# Patient Record
Sex: Female | Born: 1995 | Race: Black or African American | Hispanic: No | Marital: Single | Smoking: Former smoker
Health system: Southern US, Community
[De-identification: ages and names within clinical notes are randomized; demographics above are authoritative.]

## PROBLEM LIST (undated history)

## (undated) ENCOUNTER — Inpatient Hospital Stay (HOSPITAL_COMMUNITY): Payer: Self-pay

## (undated) DIAGNOSIS — Z3490 Encounter for supervision of normal pregnancy, unspecified, unspecified trimester: Secondary | ICD-10-CM

## (undated) DIAGNOSIS — B9689 Other specified bacterial agents as the cause of diseases classified elsewhere: Secondary | ICD-10-CM

## (undated) DIAGNOSIS — A749 Chlamydial infection, unspecified: Secondary | ICD-10-CM

## (undated) DIAGNOSIS — N760 Acute vaginitis: Secondary | ICD-10-CM

## (undated) HISTORY — PX: NO PAST SURGERIES: SHX2092

---
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
## 2016-04-02 ENCOUNTER — Encounter (HOSPITAL_COMMUNITY): Payer: Self-pay | Admitting: Emergency Medicine

## 2016-04-02 ENCOUNTER — Emergency Department (HOSPITAL_COMMUNITY)
Admission: EM | Admit: 2016-04-02 | Discharge: 2016-04-02 | Disposition: A | Discharge status: Home / Self Care | Payer: Medicaid Other | Attending: Emergency Medicine | Admitting: Emergency Medicine

## 2016-04-02 DIAGNOSIS — F17210 Nicotine dependence, cigarettes, uncomplicated: Secondary | ICD-10-CM | POA: Insufficient documentation

## 2016-04-02 DIAGNOSIS — N390 Urinary tract infection, site not specified: Secondary | ICD-10-CM

## 2016-04-02 LAB — URINALYSIS, ROUTINE W REFLEX MICROSCOPIC
BILIRUBIN URINE: NEGATIVE
GLUCOSE, UA: NEGATIVE mg/dL
Ketones, ur: NEGATIVE mg/dL
NITRITE: POSITIVE — AB
PH: 6 (ref 5.0–8.0)
Protein, ur: 30 mg/dL — AB
SPECIFIC GRAVITY, URINE: 1.015 (ref 1.005–1.030)

## 2016-04-02 LAB — URINE MICROSCOPIC-ADD ON

## 2016-04-02 LAB — PREGNANCY, URINE: Preg Test, Ur: NEGATIVE

## 2016-04-02 MED ORDER — CEPHALEXIN 500 MG PO CAPS: 500.0000 mg | ORAL_CAPSULE | Freq: Four times a day (QID) | ORAL | Status: DC

## 2016-04-02 NOTE — ED Notes (Signed)
Pt sts lower abd pain and pain with urination; pt sts currently on period

## 2016-04-02 NOTE — Discharge Instructions (Signed)

## 2016-04-02 NOTE — ED Provider Notes (Signed)
CSN: [id number]     Arrival date & time 04/02/16  1123 History   First MD Initiated Contact with Patient 04/02/16 1520     Chief Complaint  Patient presents with  . Urinary Tract Infection  . Abdominal Pain     (Consider location/radiation/quality/duration/timing/severity/associated sxs/prior Treatment) HPI 20 y.o. Female complaining of urinary frequency and suprapubic discomfort began last night.  Lmp current , normal menstrual cycle at normal time.  Denies abnormal vaginal discharge.  History of uti x 1 one year ago with similar symptoms.  Past Medical History  Diagnosis Date  . Chlamydia   . BV (bacterial vaginosis) 11/09/2012  . Pregnant 2011   Past Surgical History  Procedure Laterality Date  . No past surgeries     History reviewed. No pertinent family history. Social History  Substance Use Topics  . Smoking status: Current Every Day Smoker -- 0.20 packs/day    Types: Cigarettes  . Smokeless tobacco: Never Used  . Alcohol Use: No   OB History    Gravida Para Term Preterm AB TAB SAB Ectopic Multiple Living   3 2 2  1  1   2      Review of Systems  All other systems reviewed and are negative.     Allergies  Review of patient's allergies indicates no known allergies.  Home Medications   Prior to Admission medications   Not on File   BP 123/69 mmHg  Pulse 69  Temp(Src) 98 F (36.7 C) (Oral)  Resp 18  SpO2 100% Physical Exam  Constitutional: She is oriented to person, place, and time. She appears well-developed and well-nourished. No distress.  HENT:  Head: Normocephalic and atraumatic.  Right Ear: External ear normal.  Left Ear: External ear normal.  Nose: Nose normal.  Eyes: Conjunctivae and EOM are normal. Pupils are equal, round, and reactive to light.  Neck: Normal range of motion. Neck supple.  Pulmonary/Chest: Effort normal.  Musculoskeletal: Normal range of motion.  Neurological: She is alert and oriented to person, place, and time. She  exhibits normal muscle tone. Coordination normal.  Skin: Skin is warm and dry.  Psychiatric: She has a normal mood and affect. Her behavior is normal. Thought content normal.  Nursing note and vitals reviewed.   ED Course  Procedures (including critical care time) Labs Review Labs Reviewed  URINALYSIS, ROUTINE W REFLEX MICROSCOPIC (NOT AT [facility]) - Abnormal; Notable for the following:    APPearance TURBID (*)    Hgb urine dipstick MODERATE (*)    Protein, ur 30 (*)    Nitrite POSITIVE (*)    Leukocytes, UA LARGE (*)    All other components within normal limits  URINE MICROSCOPIC-ADD ON - Abnormal; Notable for the following:    Squamous Epithelial / LPF 0-5 (*)    Bacteria, UA MANY (*)    All other components within normal limits  PREGNANCY, URINE    Imaging Review No results found. I have personally reviewed and evaluated these images and lab results as part of my medical decision-making.   EKG Interpretation None      MDM   Final diagnoses:  UTI (lower urinary tract infection)        Margarita Grizzleanielle Jeet Shough, MD 04/02/16 [phone]

## 2016-06-28 ENCOUNTER — Emergency Department (HOSPITAL_COMMUNITY)
Admission: EM | Admit: 2016-06-28 | Discharge: 2016-06-28 | Disposition: A | Discharge status: Home / Self Care | Payer: Medicaid Other | Attending: Emergency Medicine | Admitting: Emergency Medicine

## 2016-06-28 ENCOUNTER — Encounter (HOSPITAL_COMMUNITY): Payer: Self-pay | Admitting: Emergency Medicine

## 2016-06-28 DIAGNOSIS — J0300 Acute streptococcal tonsillitis, unspecified: Secondary | ICD-10-CM

## 2016-06-28 DIAGNOSIS — F17210 Nicotine dependence, cigarettes, uncomplicated: Secondary | ICD-10-CM | POA: Insufficient documentation

## 2016-06-28 DIAGNOSIS — J029 Acute pharyngitis, unspecified: Secondary | ICD-10-CM | POA: Diagnosis present

## 2016-06-28 DIAGNOSIS — J020 Streptococcal pharyngitis: Secondary | ICD-10-CM | POA: Insufficient documentation

## 2016-06-28 LAB — RAPID STREP SCREEN (MED CTR MEBANE ONLY): Streptococcus, Group A Screen (Direct): POSITIVE — AB

## 2016-06-28 MED ORDER — IBUPROFEN 600 MG PO TABS: 600.0000 mg | ORAL_TABLET | Freq: Four times a day (QID) | ORAL | 0 refills | Status: DC | PRN

## 2016-06-28 MED ORDER — DEXAMETHASONE SODIUM PHOSPHATE 10 MG/ML IJ SOLN
10.0000 mg | Freq: Once | INTRAMUSCULAR | Status: AC
  Administered 2016-06-28: 10 mg via INTRAMUSCULAR
  Filled 2016-06-28: qty 1

## 2016-06-28 MED ORDER — PENICILLIN G BENZATHINE 1200000 UNIT/2ML IM SUSP
1.2000 10*6.[IU] | Freq: Once | INTRAMUSCULAR | Status: AC
  Administered 2016-06-28: 1.2 10*6.[IU] via INTRAMUSCULAR
  Filled 2016-06-28: qty 2

## 2016-06-28 NOTE — Discharge Instructions (Signed)
Take ibuprofen as needed for pain and fever. Return to the ER for new or worsening symptoms.

## 2016-06-28 NOTE — ED Provider Notes (Signed)
[facility]-EMERGENCY DEPT Provider Note   CSN: [id number] Arrival date & time: 06/28/16  0957   By signing my name below, I, Tracey [name], attest that this documentation has been prepared under the direction and in the presence of  Tracey [name], [place]. Electronically Signed: Clovis PuAvnee [name], ED Scribe. 06/28/16. 12:47 PM.   History   Chief Complaint Chief Complaint  Patient presents with  . swollen tonsils     The history is provided by the patient. No language interpreter was used.   HPI Comments:  Tracey [name] is a 20 y.o. female who presents to the Emergency Department complaining of sore throat x 1 day. Pt notes associated swollen tonsils, cough and discomfort while swallowing. She denies fevers, congestion and drooling. No alleviating factors noted. Pt denies any other complaints at this time.   Past Medical History:  Diagnosis Date  . BV (bacterial vaginosis) 11/09/2012  . Chlamydia   . Pregnant 2011    Patient Active Problem List   Diagnosis Date Noted  . Normal delivery 10/08/2013  . Indication for care in labor or delivery 10/07/2013    Past Surgical History:  Procedure Laterality Date  . NO PAST SURGERIES      OB History    Gravida Para Term Preterm AB Living   3 2 2   1 2    SAB TAB Ectopic Multiple Live Births   1       2       Home Medications    Prior to Admission medications   Medication Sig Start Date End Date Taking? Authorizing Provider  cephALEXin (KEFLEX) 500 MG capsule Take 1 capsule (500 mg total) by mouth 4 (four) times daily. 04/02/16   Margarita Grizzleanielle Ray, MD    Family History No family history on file.  Social History Social History  Substance Use Topics  . Smoking status: Current Every Day Smoker    Packs/day: 0.20    Types: Cigarettes  . Smokeless tobacco: Never Used  . Alcohol use No     Allergies   Review of patient's allergies indicates no known allergies.   Review of Systems Review of Systems  Constitutional: Negative for  fever.  HENT: Positive for sore throat. Negative for congestion and drooling.   Respiratory: Positive for cough.    10 Systems reviewed and are negative for acute change except as noted in the HPI.  Physical Exam Updated Vital Signs BP 117/80 (BP Location: Right Arm)   Pulse 87   Temp 98.3 F (36.8 C) (Oral)   Resp 16   LMP 06/28/2016   SpO2 100%   Physical Exam  Constitutional: She is oriented to person, place, and time. She appears well-developed and well-nourished. No distress.  HENT:  Head: Normocephalic and atraumatic.  Right Ear: External ear normal.  Left Ear: External ear normal.  Mouth/Throat: Uvula is midline. Posterior oropharyngeal erythema present. Tonsillar exudate.  Bilateral peritonsillar erythema and exudate. Uvula midline. No trismus. No evidence of PTA or RTA  Eyes: Conjunctivae are normal.  Cardiovascular: Normal rate.   Pulmonary/Chest: Effort normal.  Abdominal: She exhibits no distension.  Neurological: She is alert and oriented to person, place, and time.  Skin: Skin is warm and dry.  Psychiatric: She has a normal mood and affect.  Nursing note and vitals reviewed.    ED Treatments / Results  DIAGNOSTIC STUDIES:  Oxygen Saturation is 100% on RA, normal by my interpretation.    COORDINATION OF CARE:  12:08 PM Discussed treatment plan with  pt at bedside and pt agreed to plan.  Labs (all labs ordered are listed, but only abnormal results are displayed) Labs Reviewed  RAPID STREP SCREEN (NOT AT [facility]) - Abnormal; Notable for the following:       Result Value   Streptococcus, Group A Screen (Direct) POSITIVE (*)    All other components within normal limits    EKG  EKG Interpretation None       Radiology No results found.  Procedures Procedures (including critical care time)  Medications Ordered in ED Medications - No data to display   Initial Impression / Assessment and Plan / ED Course  I have reviewed the triage vital signs  and the nursing notes.  Pertinent labs & imaging results that were available during my care of the patient were reviewed by me and considered in my medical decision making (see chart for details).  Clinical Course    Pt rapid strep test positive. Pt is tolerating secretions. Presentation not concerning for peritonsillar abscess or spread of infection to deep spaces of the throat; patent airway. Treated with IM Pen G and decadron here. Specific return precautions discussed. Recommended PCP follow up. Pt appears safe for discharge.    Final Clinical Impressions(s) / ED Diagnoses   Final diagnoses:  Streptococcal tonsillopharyngitis    New Prescriptions Discharge Medication List as of 06/28/2016 12:36 PM    START taking these medications   Details  ibuprofen (ADVIL,MOTRIN) 600 MG tablet Take 1 tablet (600 mg total) by mouth every 6 (six) hours as needed., Starting Fri 06/28/2016, Print       I personally performed the services described in this documentation, which was scribed in my presence. The recorded information has been reviewed and is accurate.    Carlene Coria, PA-C 06/28/16 1756    Jacalyn Lefevre, MD 06/29/16 [phone]

## 2016-06-28 NOTE — ED Triage Notes (Signed)
Per pt, states right tonsil swollen, states throat sore-symptoms started yesterday

## 2016-06-28 NOTE — ED Notes (Signed)
Bed: WA27 Expected date:  Expected time:  Means of arrival:  Comments: 

## 2016-08-30 ENCOUNTER — Encounter (HOSPITAL_COMMUNITY): Payer: Self-pay

## 2016-08-30 ENCOUNTER — Emergency Department (HOSPITAL_COMMUNITY)
Admission: EM | Admit: 2016-08-30 | Discharge: 2016-08-30 | Disposition: A | Discharge status: Home / Self Care | Payer: Medicaid Other | Attending: Emergency Medicine | Admitting: Emergency Medicine

## 2016-08-30 DIAGNOSIS — B9689 Other specified bacterial agents as the cause of diseases classified elsewhere: Secondary | ICD-10-CM | POA: Diagnosis not present

## 2016-08-30 DIAGNOSIS — N760 Acute vaginitis: Secondary | ICD-10-CM | POA: Insufficient documentation

## 2016-08-30 DIAGNOSIS — B373 Candidiasis of vulva and vagina: Secondary | ICD-10-CM | POA: Insufficient documentation

## 2016-08-30 DIAGNOSIS — F17210 Nicotine dependence, cigarettes, uncomplicated: Secondary | ICD-10-CM | POA: Insufficient documentation

## 2016-08-30 DIAGNOSIS — B3731 Acute candidiasis of vulva and vagina: Secondary | ICD-10-CM

## 2016-08-30 DIAGNOSIS — L292 Pruritus vulvae: Secondary | ICD-10-CM | POA: Diagnosis present

## 2016-08-30 DIAGNOSIS — Z79899 Other long term (current) drug therapy: Secondary | ICD-10-CM | POA: Diagnosis not present

## 2016-08-30 LAB — URINALYSIS, ROUTINE W REFLEX MICROSCOPIC
BILIRUBIN URINE: NEGATIVE
GLUCOSE, UA: NEGATIVE mg/dL
HGB URINE DIPSTICK: NEGATIVE
Ketones, ur: NEGATIVE mg/dL
NITRITE: NEGATIVE
PH: 6 (ref 5.0–8.0)
Protein, ur: 30 mg/dL — AB
RBC / HPF: NONE SEEN RBC/hpf (ref 0–5)
SPECIFIC GRAVITY, URINE: 1.025 (ref 1.005–1.030)

## 2016-08-30 LAB — POC URINE PREG, ED: Preg Test, Ur: NEGATIVE

## 2016-08-30 LAB — WET PREP, GENITAL
Sperm: NONE SEEN
Trich, Wet Prep: NONE SEEN
YEAST WET PREP: NONE SEEN

## 2016-08-30 MED ORDER — METRONIDAZOLE 500 MG PO TABS: 500.0000 mg | ORAL_TABLET | Freq: Two times a day (BID) | ORAL | 0 refills | Status: DC

## 2016-08-30 MED ORDER — FLUCONAZOLE 150 MG PO TABS: 150.0000 mg | ORAL_TABLET | Freq: Once | ORAL | 0 refills | Status: AC

## 2016-08-30 NOTE — ED Triage Notes (Signed)
Pt presents for evaluation of vaginal itching x 3-4 days, states she believes she may have yeast infection. Pt denies urinary symptoms.

## 2016-08-30 NOTE — ED Notes (Signed)
MD at bedside. 

## 2016-08-30 NOTE — ED Provider Notes (Signed)
[facility] DEPT Provider Note   CSN: [id number] Arrival date & time: 08/30/16  1028     History   Chief Complaint Chief Complaint  Patient presents with  . Vaginal Itching    HPI Tracey [name] is a 20 y.o. female.  The history is provided by the patient.  Vaginal Itching  This is a new problem. The current episode started yesterday. The problem occurs constantly. The problem has been gradually worsening. Pertinent negatives include no chest pain, no abdominal pain, no headaches and no shortness of breath. The symptoms are aggravated by walking. The symptoms are relieved by position. She has tried nothing for the symptoms. The treatment provided no relief.    Past Medical History:  Diagnosis Date  . BV (bacterial vaginosis) 11/09/2012  . Chlamydia   . Pregnant 2011    Patient Active Problem List   Diagnosis Date Noted  . Normal delivery 10/08/2013  . Indication for care in labor or delivery 10/07/2013    Past Surgical History:  Procedure Laterality Date  . NO PAST SURGERIES      OB History    Gravida Para Term Preterm AB Living   3 2 2   1 2    SAB TAB Ectopic Multiple Live Births   1       2       Home Medications    Prior to Admission medications   Medication Sig Start Date End Date Taking? Authorizing Provider  cephALEXin (KEFLEX) 500 MG capsule Take 1 capsule (500 mg total) by mouth 4 (four) times daily. 04/02/16   Margarita Grizzleanielle Ray, MD  fluconazole (DIFLUCAN) 150 MG tablet Take 1 tablet (150 mg total) by mouth once. 08/30/16 08/30/16  Shaune Pollackameron Deborahann Poteat, MD  ibuprofen (ADVIL,MOTRIN) 600 MG tablet Take 1 tablet (600 mg total) by mouth every 6 (six) hours as needed. 06/28/16   Ace GinsSerena Y Sam, PA-C  metroNIDAZOLE (FLAGYL) 500 MG tablet Take 1 tablet (500 mg total) by mouth 2 (two) times daily. 08/30/16   Shaune Pollackameron Tram Wrenn, MD    Family History No family history on file.  Social History Social History  Substance Use Topics  . Smoking status: Current Every  Day Smoker    Packs/day: 0.20    Types: Cigarettes  . Smokeless tobacco: Never Used  . Alcohol use No     Allergies   Patient has no known allergies.   Review of Systems Review of Systems  Constitutional: Negative for chills and fever.  HENT: Negative for congestion, rhinorrhea and sore throat.   Eyes: Negative for visual disturbance.  Respiratory: Negative for cough, shortness of breath and wheezing.   Cardiovascular: Negative for chest pain and leg swelling.  Gastrointestinal: Negative for abdominal pain, diarrhea, nausea and vomiting.  Genitourinary: Positive for vaginal discharge. Negative for dysuria, flank pain, vaginal bleeding and vaginal pain.  Musculoskeletal: Negative for neck pain.  Skin: Negative for rash.  Allergic/Immunologic: Negative for immunocompromised state.  Neurological: Negative for syncope and headaches.  Hematological: Does not bruise/bleed easily.  All other systems reviewed and are negative.    Physical Exam Updated Vital Signs BP 114/60 (BP Location: Right Arm)   Pulse 100   Temp 99 F (37.2 C) (Oral)   Resp 15   Ht 5\' 4"  (1.626 m)   Wt 150 lb (68 kg)   LMP 07/29/2016 (Within Days)   SpO2 100%   BMI 25.75 kg/m   Physical Exam  Constitutional: She is oriented to person, place, and time. She appears well-developed  and well-nourished. No distress.  HENT:  Head: Normocephalic and atraumatic.  Eyes: Conjunctivae are normal.  Neck: Neck supple.  Cardiovascular: Normal rate, regular rhythm and normal heart sounds.  Exam reveals no friction rub.   No murmur heard. Pulmonary/Chest: Effort normal and breath sounds normal. No respiratory distress. She has no wheezes. She has no rales.  Abdominal: She exhibits no distension.  Genitourinary: Pelvic exam was performed with patient supine. There is no rash or lesion on the right labia. There is no rash or lesion on the left labia. Cervix exhibits no motion tenderness, no discharge and no friability.  Right adnexum displays no mass, no tenderness and no fullness. Left adnexum displays no mass, no tenderness and no fullness. No erythema or tenderness in the vagina. Vaginal discharge (yellow-white) found.  Musculoskeletal: She exhibits no edema.  Neurological: She is alert and oriented to person, place, and time. She exhibits normal muscle tone.  Skin: Skin is warm. Capillary refill takes less than 2 seconds.  Psychiatric: She has a normal mood and affect.  Nursing note and vitals reviewed.    ED Treatments / Results  Labs (all labs ordered are listed, but only abnormal results are displayed) Labs Reviewed  WET PREP, GENITAL - Abnormal; Notable for the following:       Result Value   Clue Cells Wet Prep HPF POC PRESENT (*)    WBC, Wet Prep HPF POC MANY (*)    All other components within normal limits  URINALYSIS, ROUTINE W REFLEX MICROSCOPIC - Abnormal; Notable for the following:    APPearance CLOUDY (*)    Protein, ur 30 (*)    Leukocytes, UA LARGE (*)    Bacteria, UA MANY (*)    Squamous Epithelial / LPF TOO NUMEROUS TO COUNT (*)    All other components within normal limits  POC URINE PREG, ED  GC/CHLAMYDIA PROBE AMP ([place]    EKG  EKG Interpretation None       Radiology No results found.  Procedures Procedures (including critical care time)  Medications Ordered in ED Medications - No data to display   Initial Impression / Assessment and Plan / ED Course  I have reviewed the triage vital signs and the nursing notes.  Pertinent labs & imaging results that were available during my care of the patient were reviewed by me and considered in my medical decision making (see chart for details).  Clinical Course     20 yo F here with mild vaginal itching and white discharge. On arrival, VSS and WNL. Abdomen soft, NT, and ND. No CMT or adnexal pain. Wet prep + BV. Suspect BV with possible mild yeast infection. No evidence of PID, TOA, or ovarian  pathology. Pt o/w HDS, well appearing with soft abdomen. No evidence of cervicitis. Will treat with outpt f/u.  Final Clinical Impressions(s) / ED Diagnoses   Final diagnoses:  BV (bacterial vaginosis)  Vaginal yeast infection    New Prescriptions Discharge Medication List as of 08/30/2016  1:40 PM    START taking these medications   Details  fluconazole (DIFLUCAN) 150 MG tablet Take 1 tablet (150 mg total) by mouth once., Starting Fri 08/30/2016, Print    metroNIDAZOLE (FLAGYL) 500 MG tablet Take 1 tablet (500 mg total) by mouth 2 (two) times daily., Starting Fri 08/30/2016, Print         Shaune Pollackameron Corinthian Kemler, MD 08/30/16 [phone]

## 2016-08-30 NOTE — ED Notes (Signed)
Pelvic cart @ bedside.  

## 2016-09-02 LAB — GC/CHLAMYDIA PROBE AMP (~~LOC~~) NOT AT ARMC
CHLAMYDIA, DNA PROBE: NEGATIVE
Neisseria Gonorrhea: NEGATIVE

## 2016-09-04 ENCOUNTER — Encounter (HOSPITAL_COMMUNITY): Payer: Self-pay | Admitting: Neurology

## 2016-09-04 ENCOUNTER — Emergency Department (HOSPITAL_COMMUNITY)
Admission: EM | Admit: 2016-09-04 | Discharge: 2016-09-04 | Disposition: A | Discharge status: Home / Self Care | Payer: Medicaid Other | Attending: Emergency Medicine | Admitting: Emergency Medicine

## 2016-09-04 DIAGNOSIS — F17210 Nicotine dependence, cigarettes, uncomplicated: Secondary | ICD-10-CM | POA: Diagnosis not present

## 2016-09-04 DIAGNOSIS — M791 Myalgia: Secondary | ICD-10-CM | POA: Insufficient documentation

## 2016-09-04 DIAGNOSIS — J069 Acute upper respiratory infection, unspecified: Secondary | ICD-10-CM | POA: Insufficient documentation

## 2016-09-04 NOTE — ED Triage Notes (Signed)
Pt reports body aches, runny nose, cough x 3 days. Denies fevers.

## 2016-09-04 NOTE — ED Provider Notes (Signed)
[facility] Provider Note   CSN: [id number] Arrival date & time: 09/04/16  1745  By signing my name below, I, Rosario AdieWilliam Andrew Hiatt, attest that this documentation has been prepared under the direction and in the presence of Rolland PorterMark Charlesetta Milliron, MD. Electronically Signed: Rosario AdieWilliam Andrew Hiatt, ED Scribe. 09/04/16. 6:27 PM.  History   Chief Complaint Chief Complaint  Patient presents with  . Generalized Body Aches  . URI   The history is provided by the patient. No language interpreter was used.   HPI Comments: Tracey [name] is a 20 y.o. female with no pertinent PMHx, who presents to the Emergency Department complaining of gradually worsening, constant generalized myalgias/arthralgias onset 3 days ago. She reports associated rhinorrhea, nasal congestion, and intermittent, dry cough secondary to her myalgia/arthragias. No noted treatments were tried prior to coming into the ED. She denies fever, chills, sore throat, nausea, vomiting, abdominal pain, ear pain, or any other associated symptoms.   Past Medical History:  Diagnosis Date  . BV (bacterial vaginosis) 11/09/2012  . Chlamydia   . Pregnant 2011   Patient Active Problem List   Diagnosis Date Noted  . Normal delivery 10/08/2013  . Indication for care in labor or delivery 10/07/2013   Past Surgical History:  Procedure Laterality Date  . NO PAST SURGERIES     OB History    Gravida Para Term Preterm AB Living   3 2 2   1 2    SAB TAB Ectopic Multiple Live Births   1       2     Home Medications    Prior to Admission medications   Medication Sig Start Date End Date Taking? Authorizing Provider  cephALEXin (KEFLEX) 500 MG capsule Take 1 capsule (500 mg total) by mouth 4 (four) times daily. 04/02/16   Margarita Grizzleanielle Ray, MD  ibuprofen (ADVIL,MOTRIN) 600 MG tablet Take 1 tablet (600 mg total) by mouth every 6 (six) hours as needed. 06/28/16   Ace GinsSerena Y Sam, PA-C  metroNIDAZOLE (FLAGYL) 500 MG tablet Take 1 tablet (500 mg total)  by mouth 2 (two) times daily. 08/30/16   Shaune Pollackameron Isaacs, MD   Family History No family history on file.  Social History Social History  Substance Use Topics  . Smoking status: Current Every Day Smoker    Packs/day: 0.20    Types: Cigarettes  . Smokeless tobacco: Never Used  . Alcohol use No   Allergies   Patient has no known allergies.  Review of Systems Review of Systems  Constitutional: Negative for appetite change, chills, diaphoresis, fatigue and fever.  HENT: Positive for congestion and rhinorrhea. Negative for ear pain, mouth sores, sore throat and trouble swallowing.   Eyes: Negative for visual disturbance.  Respiratory: Positive for cough. Negative for chest tightness, shortness of breath and wheezing.   Cardiovascular: Negative for chest pain.  Gastrointestinal: Negative for abdominal distention, abdominal pain, diarrhea, nausea and vomiting.  Endocrine: Negative for polydipsia, polyphagia and polyuria.  Genitourinary: Negative for dysuria, frequency and hematuria.  Musculoskeletal: Positive for arthralgias and myalgias. Negative for gait problem.  Skin: Negative for color change, pallor and rash.  Neurological: Negative for dizziness, syncope, light-headedness and headaches.  Hematological: Does not bruise/bleed easily.  Psychiatric/Behavioral: Negative for behavioral problems and confusion.   Physical Exam Updated Vital Signs BP 122/77 (BP Location: Left Arm)   Pulse 115   Temp 98.3 F (36.8 C) (Oral)   Resp 18   Ht 5\' 4"  (1.626 m)   Wt 150 lb (68  kg)   LMP 07/29/2016 (Within Days)   SpO2 98%   BMI 25.75 kg/m   Physical Exam  Constitutional: She is oriented to person, place, and time. She appears well-developed and well-nourished. No distress.  HENT:  Head: Normocephalic.  Eyes: Conjunctivae are normal. Pupils are equal, round, and reactive to light. No scleral icterus.  Neck: Normal range of motion. Neck supple. No thyromegaly present.  Cardiovascular:  Normal rate and regular rhythm.  Exam reveals no gallop and no friction rub.   No murmur heard. Pulmonary/Chest: Effort normal and breath sounds normal. No respiratory distress. She has no wheezes. She has no rales.  Abdominal: Soft. Bowel sounds are normal. She exhibits no distension. There is no tenderness. There is no rebound.  Musculoskeletal: Normal range of motion.  Neurological: She is alert and oriented to person, place, and time.  Skin: Skin is warm and dry. No rash noted.  Psychiatric: She has a normal mood and affect. Her behavior is normal.   ED Treatments / Results  DIAGNOSTIC STUDIES: Oxygen Saturation is 98% on RA, normal by my interpretation.   COORDINATION OF CARE: 6:26 PM-Discussed next steps with pt. Pt verbalized understanding and is agreeable with the plan.   Labs (all labs ordered are listed, but only abnormal results are displayed) Labs Reviewed - No data to display  EKG  EKG Interpretation None      Radiology No results found.  Procedures Procedures   Medications Ordered in ED Medications - No data to display  Initial Impression / Assessment and Plan / ED Course  I have reviewed the triage vital signs and the nursing notes.  Pertinent labs & imaging results that were available during my care of the patient were reviewed by me and considered in my medical decision making (see chart for details).  Clinical Course      Final Clinical Impressions(s) / ED Diagnoses   Final diagnoses:  Upper respiratory tract infection, unspecified type   New Prescriptions New Prescriptions   No medications on file   Normal exam.  No findings to suggest supportive secondary infection. Recommend expectant management, over-the-counter treatment.    Rolland PorterMark Amen Dargis, MD 09/04/16 [phone]

## 2016-09-16 NOTE — L&D Delivery Note (Signed)
  Patient is 21 y.o. [id number] 3241w6d admitted in SOL, hx of teen pregnancy   Delivery Note At 6:26 AM a viable female was delivered via Vaginal, Spontaneous Delivery (Presentation: LOA; vertex ).  APGAR: 9, 9; weight  pending.   Placenta status: delivered spontaneously, in tact.  Cord: 3vc with the following complications: none. Cord pH: not sent  Anesthesia:  epidural Episiotomy: None Lacerations:  none Suture Repair: none Est. Blood Loss (mL): 50    Mom to postpartum.  Baby to Couplet care / Skin to Skin.  Tillman Sersngela C Riccio 05/15/2017, 6:38 AM      Upon arrival patient was complete and pushing. She pushed with good maternal effort to deliver a healthy baby boy. Baby delivered without difficulty, was noted to have good tone and place on maternal abdomen for oral suctioning, drying and stimulation. Delayed cord clamping performed. Placenta delivered intact with 3V cord. Vaginal canal and perineum was inspected and in tact; hemostatic. Pitocin was started and uterus massaged until bleeding slowed. Counts of sharps, instruments, and lap pads were all correct.   Tillman SersAngela C Riccio, DO PGY-2 8/30/20186:38 AM  Patient is a [phone] at 5441w6d who was admitted in SOL, significant hx of teen preg x 3 and limited care, but otherwise uncomplicated prenatal course.  She progressed without augmentation.  I was gloved and present for delivery in its entirety.  Second stage of labor progressed, baby delivered after approx 3 contractions.  No decels during second stage noted.  Complications: none  Lacerations: none  EBL: 50cc  Cam HaiSHAW, KIMBERLY, CNM 6:42 AM 05/15/2017

## 2016-09-19 ENCOUNTER — Encounter (HOSPITAL_COMMUNITY): Payer: Self-pay

## 2016-09-19 ENCOUNTER — Inpatient Hospital Stay (HOSPITAL_COMMUNITY): Payer: Medicaid Other

## 2016-09-19 ENCOUNTER — Inpatient Hospital Stay (HOSPITAL_COMMUNITY)
Admission: AD | Admit: 2016-09-19 | Discharge: 2016-09-19 | Disposition: A | Discharge status: Home / Self Care | Payer: Medicaid Other | Source: Ambulatory Visit | Attending: Obstetrics & Gynecology | Admitting: Obstetrics & Gynecology

## 2016-09-19 DIAGNOSIS — F17210 Nicotine dependence, cigarettes, uncomplicated: Secondary | ICD-10-CM | POA: Insufficient documentation

## 2016-09-19 DIAGNOSIS — O26891 Other specified pregnancy related conditions, first trimester: Secondary | ICD-10-CM | POA: Diagnosis not present

## 2016-09-19 DIAGNOSIS — R109 Unspecified abdominal pain: Secondary | ICD-10-CM | POA: Diagnosis not present

## 2016-09-19 DIAGNOSIS — O99331 Smoking (tobacco) complicating pregnancy, first trimester: Secondary | ICD-10-CM | POA: Insufficient documentation

## 2016-09-19 DIAGNOSIS — Z3A01 Less than 8 weeks gestation of pregnancy: Secondary | ICD-10-CM | POA: Insufficient documentation

## 2016-09-19 DIAGNOSIS — O26899 Other specified pregnancy related conditions, unspecified trimester: Secondary | ICD-10-CM

## 2016-09-19 LAB — CBC WITH DIFFERENTIAL/PLATELET
BASOS PCT: 0 %
Basophils Absolute: 0 10*3/uL (ref 0.0–0.1)
EOS ABS: 0.1 10*3/uL (ref 0.0–0.7)
EOS PCT: 1 %
HCT: 36.1 % (ref 36.0–46.0)
HEMOGLOBIN: 12.3 g/dL (ref 12.0–15.0)
LYMPHS ABS: 2 10*3/uL (ref 0.7–4.0)
Lymphocytes Relative: 32 %
MCH: 27 pg (ref 26.0–34.0)
MCHC: 34.1 g/dL (ref 30.0–36.0)
MCV: 79.2 fL (ref 78.0–100.0)
Monocytes Absolute: 0.4 10*3/uL (ref 0.1–1.0)
Monocytes Relative: 6 %
NEUTROS PCT: 61 %
Neutro Abs: 3.9 10*3/uL (ref 1.7–7.7)
PLATELETS: 267 10*3/uL (ref 150–400)
RBC: 4.56 MIL/uL (ref 3.87–5.11)
RDW: 14.4 % (ref 11.5–15.5)
WBC: 6.3 10*3/uL (ref 4.0–10.5)

## 2016-09-19 LAB — URINALYSIS, ROUTINE W REFLEX MICROSCOPIC
Bilirubin Urine: NEGATIVE
Glucose, UA: NEGATIVE mg/dL
Hgb urine dipstick: NEGATIVE
KETONES UR: 20 mg/dL — AB
LEUKOCYTES UA: NEGATIVE
NITRITE: NEGATIVE
PROTEIN: NEGATIVE mg/dL
Specific Gravity, Urine: 1.018 (ref 1.005–1.030)
pH: 7 (ref 5.0–8.0)

## 2016-09-19 LAB — POCT PREGNANCY, URINE: Preg Test, Ur: POSITIVE — AB

## 2016-09-19 LAB — HCG, QUANTITATIVE, PREGNANCY: HCG, BETA CHAIN, QUANT, S: 36230 m[IU]/mL — AB (ref <5)

## 2016-09-19 MED ORDER — PRENATAL COMPLETE 14-0.4 MG PO TABS: 1.0000 | ORAL_TABLET | Freq: Every day | ORAL | 6 refills | Status: DC

## 2016-09-19 NOTE — Discharge Instructions (Signed)

## 2016-09-19 NOTE — MAU Note (Signed)
Pt presents to MAU with complaints of a Positive pregnancy test 3 days ago and now is experiencing lower abdominal cramping. Denies any vaginal bleeding or abnormal discharge  LMP 07/29/16

## 2016-09-19 NOTE — MAU Provider Note (Signed)
History     CSN: [id number]  Arrival date and time: 09/19/16 1421   First Provider Initiated Contact with Patient 09/19/16 1610      Chief Complaint  Patient presents with  . Possible Pregnancy  . Abdominal Pain   HPI  Ms. Tracey [name] is a 21 y.o. [phone] at [redacted]w[redacted]d who presents to MAU today with complaint of intermittent abdominal pain x 2 days. The patient states that pain has been intermittent and she denies pain now. She has not taken any pain medication. She denies vaginal bleeding, discharge, N/V/D or UTI symptoms. LMP 07/29/16 and recent +HPT  OB History    Gravida Para Term Preterm AB Living   4 2 2   1 2    SAB TAB Ectopic Multiple Live Births   1       2      Past Medical History:  Diagnosis Date  . BV (bacterial vaginosis) 11/09/2012  . Chlamydia   . Pregnant 2011    Past Surgical History:  Procedure Laterality Date  . NO PAST SURGERIES      History reviewed. No pertinent family history.  Social History  Substance Use Topics  . Smoking status: Current Every Day Smoker    Packs/day: 0.20    Types: Cigarettes  . Smokeless tobacco: Never Used  . Alcohol use No    Allergies: No Known Allergies  No prescriptions prior to admission.    Review of Systems  Constitutional: Negative for fever.  Gastrointestinal: Positive for abdominal pain. Negative for constipation, diarrhea, nausea and vomiting.  Genitourinary: Negative for dysuria, frequency, urgency, vaginal bleeding and vaginal discharge.   Physical Exam   Blood pressure 111/65, pulse 81, temperature 98.2 F (36.8 C), resp. rate 18, height 5\' 4"  (1.626 m), weight 150 lb (68 kg), last menstrual period 07/29/2016, unknown if currently breastfeeding.  Physical Exam  Nursing note and vitals reviewed. Constitutional: She is oriented to person, place, and time. She appears well-developed and well-nourished. No distress.  HENT:  Head: Normocephalic and atraumatic.  Cardiovascular: Normal rate.    Respiratory: Effort normal.  GI: Soft. She exhibits no distension and no mass. There is no tenderness. There is no rebound and no guarding.  Neurological: She is alert and oriented to person, place, and time.  Skin: Skin is warm and dry. No erythema.  Psychiatric: She has a normal mood and affect.    Results for orders placed or performed during the hospital encounter of 09/19/16 (from the past 24 hour(s))  Urinalysis, Routine w reflex microscopic     Status: Abnormal   Collection Time: 09/19/16  2:30 PM  Result Value Ref Range   Color, Urine YELLOW YELLOW   APPearance CLEAR CLEAR   Specific Gravity, Urine 1.018 1.005 - 1.030   pH 7.0 5.0 - 8.0   Glucose, UA NEGATIVE NEGATIVE mg/dL   Hgb urine dipstick NEGATIVE NEGATIVE   Bilirubin Urine NEGATIVE NEGATIVE   Ketones, ur 20 (A) NEGATIVE mg/dL   Protein, ur NEGATIVE NEGATIVE mg/dL   Nitrite NEGATIVE NEGATIVE   Leukocytes, UA NEGATIVE NEGATIVE  Pregnancy, urine POC     Status: Abnormal   Collection Time: 09/19/16  2:52 PM  Result Value Ref Range   Preg Test, Ur POSITIVE (A) NEGATIVE  CBC with Differential/Platelet     Status: None   Collection Time: 09/19/16  3:04 PM  Result Value Ref Range   WBC 6.3 4.0 - 10.5 K/uL   RBC 4.56 3.87 - 5.11 MIL/uL  Hemoglobin 12.3 12.0 - 15.0 g/dL   HCT 16.1 09.6 - 04.5 %   MCV 79.2 78.0 - 100.0 fL   MCH 27.0 26.0 - 34.0 pg   MCHC 34.1 30.0 - 36.0 g/dL   RDW 40.9 81.1 - 91.4 %   Platelets 267 150 - 400 K/uL   Neutrophils Relative % 61 %   Neutro Abs 3.9 1.7 - 7.7 K/uL   Lymphocytes Relative 32 %   Lymphs Abs 2.0 0.7 - 4.0 K/uL   Monocytes Relative 6 %   Monocytes Absolute 0.4 0.1 - 1.0 K/uL   Eosinophils Relative 1 %   Eosinophils Absolute 0.1 0.0 - 0.7 K/uL   Basophils Relative 0 %   Basophils Absolute 0.0 0.0 - 0.1 K/uL  hCG, quantitative, pregnancy     Status: Abnormal   Collection Time: 09/19/16  3:04 PM  Result Value Ref Range   hCG, Beta Chain, Quant, S 36,230 (H) <5 mIU/mL    US Ob Comp Less 14 Wks  Result Date: 09/19/2016 CLINICAL DATA:  Abdominal pain affecting pregnancy, cramping for 2-3 days ; no quantitative beta HCG available for correlation ; EGA by LMP of 07/29/2016 = 7 weeks 3 days EXAM: OBSTETRIC <14 WK [place] AND TRANSVAGINAL OB US TECHNIQUE: Both transabdominal and transvaginal ultrasound examinations were performed for complete evaluation of the gestation as well as the maternal uterus, adnexal regions, and pelvic cul-de-sac. Transvaginal technique was performed to assess early pregnancy. COMPARISON:  None for this gestation FINDINGS: Intrauterine gestational sac: Present Yolk sac:  Present Embryo:  Present Cardiac Activity: Present Heart Rate: 108  bpm CRL:  3.5  mm   5 w   6 d                  [place] EDC: 05/16/2017 Subchorionic hemorrhage:  None definitely visualized Maternal uterus/adnexae: Small corpus luteal cyst LEFT ovary. Ovaries otherwise unremarkable. Trace free pelvic fluid in LEFT adnexa. No adnexal masses. IMPRESSION: Single live intrauterine gestation measured at 5 weeks 6 days EGA by crown-rump length. No acute abnormalities. Electronically Signed   By: Ulyses Southward M.D.   On: 09/19/2016 15:57   US Ob Transvaginal  Result Date: 09/19/2016 CLINICAL DATA:  Abdominal pain affecting pregnancy, cramping for 2-3 days ; no quantitative beta HCG available for correlation ; EGA by LMP of 07/29/2016 = 7 weeks 3 days EXAM: OBSTETRIC <14 WK [place] AND TRANSVAGINAL OB US TECHNIQUE: Both transabdominal and transvaginal ultrasound examinations were performed for complete evaluation of the gestation as well as the maternal uterus, adnexal regions, and pelvic cul-de-sac. Transvaginal technique was performed to assess early pregnancy. COMPARISON:  None for this gestation FINDINGS: Intrauterine gestational sac: Present Yolk sac:  Present Embryo:  Present Cardiac Activity: Present Heart Rate: 108  bpm CRL:  3.5  mm   5 w   6 d                  [place] EDC: 05/16/2017 Subchorionic  hemorrhage:  None definitely visualized Maternal uterus/adnexae: Small corpus luteal cyst LEFT ovary. Ovaries otherwise unremarkable. Trace free pelvic fluid in LEFT adnexa. No adnexal masses. IMPRESSION: Single live intrauterine gestation measured at 5 weeks 6 days EGA by crown-rump length. No acute abnormalities. Electronically Signed   By: Ulyses Southward M.D.   On: 09/19/2016 15:57    MAU Course  Procedures None  MDM +UPT UA, CBC, quant hCG, HIV, RPR and [place] today to rule out ectopic pregnancy Patient had negative wet prep and  GC/Chlamydia 2 weeks ago O+ blood type in Epic from previous visit  Assessment and Plan  A: SIUP at 7025w6d Abdominal pain in pregnancy, first trimester  P: Discharge home Tylenol PRN for pain advised First trimester precautions discussed Patient advised to follow-up with OB provider of choice Pregnancy confirmation letter given with list of area OB providers Patient may return to MAU as needed or if her condition were to change or worsen  Marny LowensteinJulie N Wenzel, PA-C  09/19/2016, 6:03 PM

## 2016-09-20 LAB — HIV ANTIBODY (ROUTINE TESTING W REFLEX): HIV SCREEN 4TH GENERATION: NONREACTIVE

## 2016-09-20 LAB — RPR: RPR Ser Ql: NONREACTIVE

## 2016-10-25 ENCOUNTER — Encounter: Payer: Self-pay | Admitting: Internal Medicine

## 2016-10-25 ENCOUNTER — Ambulatory Visit (INDEPENDENT_AMBULATORY_CARE_PROVIDER_SITE_OTHER): Payer: Medicaid Other | Admitting: Internal Medicine

## 2016-10-25 VITALS — BP 106/70 | HR 83 | Temp 98.4°F | Ht 64.0 in | Wt 145.0 lb

## 2016-10-25 DIAGNOSIS — Z3481 Encounter for supervision of other normal pregnancy, first trimester: Secondary | ICD-10-CM | POA: Diagnosis not present

## 2016-10-25 DIAGNOSIS — Z3401 Encounter for supervision of normal first pregnancy, first trimester: Secondary | ICD-10-CM

## 2016-10-25 DIAGNOSIS — Z3483 Encounter for supervision of other normal pregnancy, third trimester: Secondary | ICD-10-CM | POA: Insufficient documentation

## 2016-10-25 LAB — POCT URINALYSIS DIPSTICK
BILIRUBIN UA: NEGATIVE
Glucose, UA: NEGATIVE
KETONES UA: NEGATIVE
Nitrite, UA: NEGATIVE
PH UA: 7
Protein, UA: NEGATIVE
RBC UA: NEGATIVE
SPEC GRAV UA: 1.015
Urobilinogen, UA: 2

## 2016-10-25 LAB — POCT URINE PREGNANCY: Preg Test, Ur: POSITIVE — AB

## 2016-10-25 LAB — POCT UA - MICROSCOPIC ONLY: Epithelial cells, urine per micros: >20

## 2016-10-25 MED ORDER — PRENATAL VITAMINS 0.8 MG PO TABS: 1.0000 | ORAL_TABLET | Freq: Every day | ORAL | 3 refills | Status: DC

## 2016-10-25 NOTE — Progress Notes (Signed)
   [facility] Phone: [phone]  Subjective:  Tracey [name] is a 21 year old female presenting to clinic for a new patient appointment. She is currently ~[redacted] weeks pregnant and needs to receive prenatal care.  ROS: Nausea and vomiting, no abdominal pain, no vaginal bleeding, no cramping.  Past Medical History- none  Past Surgical History- none  Family history- maternal grandmother with HTN  Social history- works at [employer]. Has two other children (ages 546 and 3). Former smoker- only smoked for 2 months a couple of years ago. Occasional alcohol use when not pregnant. No drug use. Sexually active with one female partner.  Medications- prenatal vitamin  Objective: BP 106/70 (BP Location: Right Arm, Patient Position: Sitting, Cuff Size: Normal)   Pulse 83   Temp 98.4 F (36.9 C) (Oral)   Ht 5\' 4"  (1.626 m)   Wt 145 lb (65.8 kg)   LMP 08/09/2016 (Exact Date)   SpO2 98%   Breastfeeding? No   BMI 24.89 kg/m  Gen: NAD, alert, cooperative with exam CV: RRR, II/VI systolic murmur loudest in the LUSB Resp: CTABL, no wheezes, normal work of breathing GI: SNTND, BS present, no guarding or organomegaly  Assessment/Plan: Pregnancy:  Pt states she is about [redacted] weeks pregnant.  - Prescribed prenatal vitamin - Obtained urine pregnancy test and complete OB panel - Advised Pt to follow-up in 1-2 weeks for an initial prenatal visit.   Willadean CarolKaty Sharica Roedel, MD PGY-2

## 2016-10-25 NOTE — Assessment & Plan Note (Signed)
Pt states she is about [redacted] weeks pregnant.  - Prescribed prenatal vitamin - Obtained complete OB panel - Advised Pt to follow-up in 1-2 weeks for an initial prenatal visit.

## 2016-10-25 NOTE — Progress Notes (Deleted)
   [facility] Phone: [phone]  Subjective:  ***  ROS: See HPI for pertinent positives and negatives  Past Medical History- None  Past Surgical History- None  Family history -Maternal grandmother- HTN  Social history- Work at [employer]. Has two children (ages 266 and 3). Former smoker- smoked for 2 months a couple years ago. Occasional alcohol use when not pregnant. No drug use. Sexually active with one partner.  Medications- prenatal vitamin daily   Objective: BP 106/70 (BP Location: Right Arm, Patient Position: Sitting, Cuff Size: Normal)   Pulse 83   Temp 98.4 F (36.9 C) (Oral)   Ht 5\' 4"  (1.626 m)   Wt 145 lb (65.8 kg)   LMP 08/09/2016 (Exact Date)   SpO2 98%   Breastfeeding? No   BMI 24.89 kg/m  Gen: NAD, alert, cooperative with exam HEENT: NCAT, EOMI, MMM Neck: FROM, supple CV: RRR, no murmur Resp: CTABL, no wheezes, normal work of breathing GI: SNTND, BS present, no guarding or organomegaly Msk: No edema, warm, normal tone, moves UE/LE spontaneously Neuro: Alert and oriented, no gross deficits Skin: No rashes, no lesions Psych: Appropriate behavior  Assessment/Plan: See problem based a/p   Willadean CarolKaty Evelin Cake, MD PGY-2

## 2016-10-25 NOTE — Patient Instructions (Addendum)
It was so nice to meet you!  We will see you back in the next 1-2 weeks for your initial OB visit. This will be a long visit!  I sent the prescription for the prenatal vitamin into your pharmacy.   -Dr. Nancy MarusMayo

## 2016-10-26 LAB — HIV ANTIBODY (ROUTINE TESTING W REFLEX): HIV: NONREACTIVE

## 2016-10-26 LAB — SICKLE CELL SCREEN: SICKLE CELL SCREEN: NEGATIVE

## 2016-10-27 LAB — CULTURE, OB URINE

## 2016-10-28 LAB — OBSTETRIC PANEL
Antibody Screen: NEGATIVE
BASOS ABS: 0 {cells}/uL (ref 0–200)
Basophils Relative: 0 %
EOS ABS: 59 {cells}/uL (ref 15–500)
Eosinophils Relative: 1 %
HCT: 36.6 % (ref 35.0–45.0)
HEP B S AG: NEGATIVE
Hemoglobin: 11.7 g/dL (ref 11.7–15.5)
LYMPHS ABS: 1534 {cells}/uL (ref 850–3900)
Lymphocytes Relative: 26 %
MCH: 26.3 pg — ABNORMAL LOW (ref 27.0–33.0)
MCHC: 32 g/dL (ref 32.0–36.0)
MCV: 82.2 fL (ref 80.0–100.0)
MPV: 9.5 fL (ref 7.5–12.5)
Monocytes Absolute: 767 cells/uL (ref 200–950)
Monocytes Relative: 13 %
NEUTROS ABS: 3540 {cells}/uL (ref 1500–7800)
Neutrophils Relative %: 60 %
PLATELETS: 263 10*3/uL (ref 140–400)
RBC: 4.45 MIL/uL (ref 3.80–5.10)
RDW: 14.8 % (ref 11.0–15.0)
RUBELLA: 2.13 {index} — AB (ref <0.90)
Rh Type: POSITIVE
WBC: 5.9 10*3/uL (ref 3.8–10.8)

## 2016-11-04 ENCOUNTER — Encounter (HOSPITAL_COMMUNITY): Payer: Self-pay | Admitting: Nurse Practitioner

## 2016-11-04 ENCOUNTER — Emergency Department (HOSPITAL_COMMUNITY)
Admission: EM | Admit: 2016-11-04 | Discharge: 2016-11-04 | Disposition: A | Discharge status: Home / Self Care | Payer: Medicaid Other | Attending: Emergency Medicine | Admitting: Emergency Medicine

## 2016-11-04 DIAGNOSIS — L02413 Cutaneous abscess of right upper limb: Secondary | ICD-10-CM | POA: Insufficient documentation

## 2016-11-04 DIAGNOSIS — L0291 Cutaneous abscess, unspecified: Secondary | ICD-10-CM

## 2016-11-04 DIAGNOSIS — Z87891 Personal history of nicotine dependence: Secondary | ICD-10-CM | POA: Insufficient documentation

## 2016-11-04 MED ORDER — HYDROCODONE-ACETAMINOPHEN 5-325 MG PO TABS: 2.0000 | ORAL_TABLET | ORAL | 0 refills | Status: DC | PRN

## 2016-11-04 MED ORDER — LIDOCAINE HCL (PF) 1 % IJ SOLN
5.0000 mL | Freq: Once | INTRAMUSCULAR | Status: AC
  Administered 2016-11-04: 5 mL
  Filled 2016-11-04: qty 5

## 2016-11-04 MED ORDER — CEPHALEXIN 500 MG PO CAPS: 500.0000 mg | ORAL_CAPSULE | Freq: Four times a day (QID) | ORAL | 0 refills | Status: DC

## 2016-11-04 MED ORDER — SULFAMETHOXAZOLE-TRIMETHOPRIM 800-160 MG PO TABS: 1.0000 | ORAL_TABLET | Freq: Two times a day (BID) | ORAL | 0 refills | Status: DC

## 2016-11-04 NOTE — Discharge Instructions (Signed)
Return if any problems.

## 2016-11-04 NOTE — ED Provider Notes (Signed)
[facility]-EMERGENCY DEPT Provider Note   CSN: [id number] Arrival date & time: 11/04/16  1217  By signing my name below, I, Tracey [name], attest that this documentation has been prepared under the direction and in the presence of non-physician practitioner, Ok Edwards, PA-C. Electronically Signed: Majel [name], Scribe. 11/04/2016. 1:01 PM.  History   Chief Complaint Chief Complaint  Patient presents with  . Skin Problem   The history is provided by the patient. No language interpreter was used.   HPI Comments: Tracey [name] is a 21 y.o. female who presents to the Emergency Department complaining of gradual onset, painful "boil" to her right axilla that appeared ~2 days ago. Pt reports she has applied warm compresses to her boil with mild relief. She notes she has experienced similar symptoms in the past but never visited the ED for antibiotics because they "drained on its own." She denies fever and chills.    Past Medical History:  Diagnosis Date  . BV (bacterial vaginosis) 11/09/2012  . Chlamydia   . Pregnant 2011   Patient Active Problem List   Diagnosis Date Noted  . Encounter for supervision of other normal pregnancy 10/25/2016   Past Surgical History:  Procedure Laterality Date  . NO PAST SURGERIES     OB History    Gravida Para Term Preterm AB Living   4 2 2   1 2    SAB TAB Ectopic Multiple Live Births   1       2     Home Medications    Prior to Admission medications   Medication Sig Start Date End Date Taking? Authorizing Provider  Prenatal Multivit-Min-Fe-FA (PRENATAL VITAMINS) 0.8 MG tablet Take 1 tablet by mouth daily. 10/25/16   Campbell Stall, MD    Family History Family History  Problem Relation Age of Onset  . Hypertension Maternal Grandmother     Social History Social History  Substance Use Topics  . Smoking status: Former Smoker    Packs/day: 0.20    Types: Cigarettes  . Smokeless tobacco: Never Used  . Alcohol use No   Allergies     Patient has no known allergies.  Review of Systems Review of Systems  Constitutional: Negative for chills and fever.  Skin:       +painful boil to right axilla   Physical Exam Updated Vital Signs BP 108/65   Pulse 83   Temp 98.2 F (36.8 C) (Oral)   Resp 17   LMP 08/09/2016 (Exact Date)   SpO2 100%   Physical Exam  Constitutional: She is oriented to person, place, and time. She appears well-developed and well-nourished.  HENT:  Head: Normocephalic.  Eyes: EOM are normal.  Neck: Normal range of motion.  Pulmonary/Chest: Effort normal.  Abdominal: She exhibits no distension.  Musculoskeletal: Normal range of motion.  Neurological: She is alert and oriented to person, place, and time.  Skin:  2 x 2 cm abscess to the right axilla   Psychiatric: She has a normal mood and affect.  Nursing note and vitals reviewed.  ED Treatments / Results  DIAGNOSTIC STUDIES:  Oxygen Saturation is 100% on RA, normal by my interpretation.    COORDINATION OF CARE:  1:01 PM Discussed treatment plan with pt at bedside and pt agreed to plan.  Labs (all labs ordered are listed, but only abnormal results are displayed) Labs Reviewed - No data to display  EKG  EKG Interpretation None       Radiology No results found.  Procedures .Marland Kitchen.Incision and Drainage Date/Time: 11/04/2016 1:11 PM Performed by: Elson AreasSOFIA, Tyleek Smick K Authorized by: Elson AreasSOFIA, Jenavive Lamboy K   Consent:    Consent obtained:  Verbal   Consent given by:  Patient   Risks discussed:  Bleeding, incomplete drainage, pain, infection and damage to other organs Location:    Type:  Abscess   Location:  Upper extremity   Upper extremity location:  Arm   Arm location:  R upper arm Pre-procedure details:    Skin preparation:  Betadine Anesthesia (see MAR for exact dosages):    Anesthesia method:  Local infiltration   Local anesthetic:  Lidocaine 2% w/o epi Procedure type:    Complexity:  Simple Procedure details:    Scalpel blade:   11   Drainage:  Bloody and purulent Post-procedure details:    Patient tolerance of procedure:  Tolerated well, no immediate complications    (including critical care time)  Medications Ordered in ED Medications - No data to display  Initial Impression / Assessment and Plan / ED Course  I have reviewed the triage vital signs and the nursing notes.  Pertinent labs & imaging results that were available during my care of the patient were reviewed by me and considered in my medical decision making (see chart for details).     Patient with skin abscess. Incision and drainage performed in the ED today.  Abscess was not large enough to warrant packing or drain placement. Wound recheck in 2 days. Supportive care and return precautions discussed.  Pt sent home with  The patient appears reasonably screened and/or stabilized for discharge and I doubt any other emergent medical condition requiring further screening, evaluation, or treatment in the ED prior to discharge.    Final Clinical Impressions(s) / ED Diagnoses   Final diagnoses:  Abscess    New Prescriptions New Prescriptions   CEPHALEXIN (KEFLEX) 500 MG CAPSULE    Take 1 capsule (500 mg total) by mouth 4 (four) times daily.  An After Visit Summary was printed and given to the patient.   Elson AreasLeslie K Alexandru Moorer, PA-C 11/04/16 1408  I personally performed the services in this documentation, which was scribed in my presence.  The recorded information has been reviewed and considered.   Barnet PallKaren SofiaPAC.   Lonia SkinnerLeslie K BushlandSofia, PA-C 11/04/16 1408    Benjiman CoreNathan Pickering, MD 11/04/16 2028

## 2016-11-04 NOTE — ED Triage Notes (Signed)
Pt presents with c/o skin problem. She has an abscess under her R armpit x 2 days that has been increasingly painful and swollen since onset. She has been putting warm compresses on the abscess with some improvement in pain

## 2016-11-04 NOTE — ED Notes (Signed)
No drainage. Patient has had boil before that resolved on its own. Patient states it is very painful.

## 2016-11-18 ENCOUNTER — Emergency Department (HOSPITAL_COMMUNITY)
Admission: EM | Admit: 2016-11-18 | Discharge: 2016-11-18 | Disposition: A | Discharge status: Home / Self Care | Payer: Medicaid Other | Attending: Emergency Medicine | Admitting: Emergency Medicine

## 2016-11-18 ENCOUNTER — Encounter (HOSPITAL_COMMUNITY): Payer: Self-pay | Admitting: Emergency Medicine

## 2016-11-18 DIAGNOSIS — R109 Unspecified abdominal pain: Secondary | ICD-10-CM | POA: Diagnosis not present

## 2016-11-18 DIAGNOSIS — Z87891 Personal history of nicotine dependence: Secondary | ICD-10-CM | POA: Diagnosis not present

## 2016-11-18 DIAGNOSIS — Z5321 Procedure and treatment not carried out due to patient leaving prior to being seen by health care provider: Secondary | ICD-10-CM | POA: Insufficient documentation

## 2016-11-18 NOTE — ED Notes (Signed)
Pt called for room with no answer. 

## 2016-11-18 NOTE — ED Notes (Signed)
Called for Pod C x 3.

## 2016-11-18 NOTE — ED Notes (Signed)
Called pt again to bring back to room, had no answer.

## 2016-11-18 NOTE — ED Triage Notes (Signed)
Pt here with lower abd cramping; pt sts thinks she is 3 months pregnant but can not remember her LMP; pt denies having any pre natal care; pt G5 P2 M1 A1

## 2016-11-20 ENCOUNTER — Other Ambulatory Visit: Payer: Medicaid Other

## 2016-11-26 NOTE — Progress Notes (Deleted)
Tracey [name] is a 21 y.o. yo [id number] at 871w5d who presents for her initial prenatal visit. Pregnancy {is/is not:9024} planned She reports {pregnancy symptoms:18128}. She  {is/is not:9024} taking PNV. See flow sheet for details.  PMH, POBH, FH, meds, allergies and Social Hx reviewed.  Prenatal Exam: Gen: Well nourished, well developed.  No distress.  Vitals noted. HEENT: Normocephalic, atraumatic.  Neck supple without cervical lymphadenopathy, thyromegaly or thyroid nodules.  Fair dentition. CV: RRR no murmur, gallops or rubs Lungs: CTAB.  Normal respiratory effort without wheezes or rales. Abd: soft, NTND. +BS.  Uterus not appreciated above pelvis. GU: Normal external female genitalia without lesions.  Normal vaginal, well rugated without lesions. No vaginal discharge.  Bimanual exam: No adnexal mass or TTP. No CMT.  Uterus size *** Ext: No clubbing, cyanosis or edema. Psych: Normal grooming and dress.  Not depressed or anxious appearing.  Normal thought content and process without flight of ideas or looseness of associations.  Assessment & Plan: 1) 21 y.o. yo [id number] at 2060w4d via early ultrasound doing well.  Current pregnancy issues include ***. Dating {is/is not:9024} reliable. Prenatal labs reviewed, notable for ***. Genetic screening offered: ***. Early glucola {is/is [id number]:19887} indicated.  PHQ-9 and Pregnancy Medical Home forms completed and reviewed.  Bleeding and pain precautions reviewed. Importance of prenatal vitamins reviewed.  Follow up in 4 weeks.

## 2016-11-27 ENCOUNTER — Encounter: Payer: Medicaid Other | Admitting: Family Medicine

## 2016-12-09 ENCOUNTER — Other Ambulatory Visit: Payer: Medicaid Other

## 2016-12-20 ENCOUNTER — Other Ambulatory Visit: Payer: Medicaid Other

## 2016-12-24 ENCOUNTER — Ambulatory Visit (INDEPENDENT_AMBULATORY_CARE_PROVIDER_SITE_OTHER): Payer: Medicaid Other | Admitting: Family Medicine

## 2016-12-24 ENCOUNTER — Other Ambulatory Visit (HOSPITAL_COMMUNITY)
Admission: RE | Admit: 2016-12-24 | Discharge: 2016-12-24 | Disposition: A | Discharge status: Home / Self Care | Payer: Medicaid Other | Source: Ambulatory Visit | Attending: Family Medicine | Admitting: Family Medicine

## 2016-12-24 ENCOUNTER — Encounter: Payer: Self-pay | Admitting: Family Medicine

## 2016-12-24 VITALS — BP 102/50 | HR 75 | Temp 98.4°F | Wt 147.8 lb

## 2016-12-24 DIAGNOSIS — B3731 Acute candidiasis of vulva and vagina: Secondary | ICD-10-CM

## 2016-12-24 DIAGNOSIS — Z3482 Encounter for supervision of other normal pregnancy, second trimester: Secondary | ICD-10-CM

## 2016-12-24 DIAGNOSIS — B373 Candidiasis of vulva and vagina: Secondary | ICD-10-CM

## 2016-12-24 LAB — POCT WET PREP (WET MOUNT)
CLUE CELLS WET PREP WHIFF POC: NEGATIVE
Trichomonas Wet Prep HPF POC: ABSENT

## 2016-12-24 MED ORDER — CLOTRIMAZOLE 1 % VA CREA: 1.0000 | TOPICAL_CREAM | Freq: Every day | VAGINAL | 0 refills | Status: DC

## 2016-12-24 NOTE — Progress Notes (Signed)
Tracey [name] is a 21 y.o. yo W1X9147 at [redacted]w[redacted]d who presents for her initial prenatal visit. Pregnancy is not planned She reports no concerns. She  is taking PNV but frequently forgets. See flow sheet for details.  PMH, POBH, FH, meds, allergies and Social Hx reviewed.  Prenatal Exam: Gen: Well nourished, well developed.  No distress.  Vitals noted. HEENT: Normocephalic, atraumatic.  Neck supple without cervical lymphadenopathy, thyromegaly or thyroid nodules.  Fair dentition. CV: RRR no murmur, gallops or rubs Lungs: CTAB.  Normal respiratory effort without wheezes or rales. Abd: soft, NTND. +BS.  Uterus not appreciated above pelvis. GU: Normal external female genitalia without lesions.  Normal vaginal, well rugated without lesions. No vaginal discharge.  Bimanual exam: No adnexal mass or TTP. No CMT.  Uterus size at umbilicus Ext: No clubbing, cyanosis or edema. Psych: Normal grooming and dress.  Not depressed or anxious appearing.  Normal thought content and process without flight of ideas or looseness of associations.  Assessment & Plan: 1) 21 y.o. yo [id number] at [redacted]w[redacted]d via early ultrasound doing well.  Current pregnancy issues include none. Dating is reliable. Prenatal labs reviewed, notable for none. Genetic screening offered: declined. Early glucola is not indicated.  PHQ-9 (score 4) and Pregnancy Medical Home forms completed and reviewed.  Wet prep with few yeast, will treat with clotrimazole 1% for 7 days.  Bleeding and pain precautions reviewed. Importance of prenatal vitamins reviewed.  Anatomy scan scheduled Follow up with OB clinic in 2 weeks and follow up with me in 4 weeks.

## 2016-12-24 NOTE — Patient Instructions (Addendum)
It was good to meet you today.   - Please take your prenatal vitamin everyday.  We are checking some labs today, and someone will call you or send you a letter with the results when they are available.   Please make an appointment with OB clinic in 2 weeks and see me back in 4 weeks  Take care and seek immediate care sooner if you develop any concerns.   Dr. Leland Her, DO [facility]  Safe Medications in Pregnancy   Acne:  Benzoyl Peroxide  Salicylic Acid   Backache/Headache:  Tylenol: 2 regular strength every 4 hours OR        2 Extra strength every 6 hours   Colds/Coughs/Allergies:  Benadryl (alcohol free) 25 mg every 6 hours as needed  Breath right strips  Claritin  Cepacol throat lozenges  Chloraseptic throat spray  Cold-Eeze- up to three times per day  Cough drops, alcohol free  Flonase (by prescription only)  Guaifenesin  Mucinex  Robitussin DM (plain only, alcohol free)  Saline nasal spray/drops  Sudafed (pseudoephedrine) & Actifed * use only after [redacted] weeks gestation and if you do not have high blood pressure  Tylenol  Vicks Vaporub  Zinc lozenges  Zyrtec   Constipation:  Colace  Ducolax suppositories  Fleet enema  Glycerin suppositories  Metamucil  Milk of magnesia  Miralax  Senokot  Smooth move tea   Diarrhea:  Kaopectate  Imodium A-D   *NO pepto Bismol   Hemorrhoids:  Anusol  Anusol HC  Preparation H  Tucks   Indigestion:  Tums  Maalox  Mylanta  Zantac  Pepcid   Insomnia:  Benadryl (alcohol free)  every 6 hours as needed  Tylenol PM  Unisom, no Gelcaps   Leg Cramps:  Tums  MagGel   Nausea/Vomiting:  Bonine  Dramamine  Emetrol  Ginger extract  Sea bands  Meclizine  Nausea medication to take during pregnancy:  Unisom (doxylamine succinate 25 mg tablets) Take one tablet daily at bedtime. If symptoms are not adequately controlled, the dose can be increased to a maximum recommended dose of two  tablets daily (1/2 tablet in the morning, 1/2 tablet mid-afternoon and one at bedtime).  Vitamin B6  tablets. Take one tablet twice a day (up to 200 mg per day).   Skin Rashes:  Aveeno products  Benadryl cream or  every 6 hours as needed  Calamine Lotion  1% cortisone cream   Yeast infection:  Gyne-lotrimin 7  Monistat 7    **If taking multiple medications, please check labels to avoid duplicating the same active ingredients  **take medication as directed on the label  ** Do not exceed 4000 mg of tylenol in 24 hours  **Do not take medications that contain aspirin or ibuprofen

## 2016-12-25 ENCOUNTER — Encounter: Payer: Self-pay | Admitting: Family Medicine

## 2016-12-25 LAB — CERVICOVAGINAL ANCILLARY ONLY
Chlamydia: NEGATIVE
Neisseria Gonorrhea: NEGATIVE
Trichomonas: NEGATIVE

## 2017-01-06 ENCOUNTER — Ambulatory Visit (HOSPITAL_COMMUNITY)
Admission: RE | Admit: 2017-01-06 | Discharge: 2017-01-06 | Disposition: A | Discharge status: Home / Self Care | Payer: Medicaid Other | Source: Ambulatory Visit | Attending: Family Medicine | Admitting: Family Medicine

## 2017-01-06 DIAGNOSIS — Z363 Encounter for antenatal screening for malformations: Secondary | ICD-10-CM | POA: Insufficient documentation

## 2017-01-06 DIAGNOSIS — Z3482 Encounter for supervision of other normal pregnancy, second trimester: Secondary | ICD-10-CM

## 2017-01-06 DIAGNOSIS — Z3A21 21 weeks gestation of pregnancy: Secondary | ICD-10-CM | POA: Diagnosis not present

## 2017-01-08 ENCOUNTER — Encounter: Payer: Medicaid Other | Admitting: Internal Medicine

## 2017-01-09 ENCOUNTER — Telehealth: Payer: Self-pay | Admitting: Family Medicine

## 2017-01-09 DIAGNOSIS — Z3492 Encounter for supervision of normal pregnancy, unspecified, second trimester: Secondary | ICD-10-CM

## 2017-01-09 NOTE — Telephone Encounter (Signed)
Discussed anatomy scan results with patient that incomplete and needs follow up ultrasound in 4-6 weeks to complete anatomysurvey. Please schedule and inform patient. Thank you.

## 2017-01-13 ENCOUNTER — Other Ambulatory Visit: Payer: Self-pay | Admitting: Internal Medicine

## 2017-01-13 DIAGNOSIS — Z3402 Encounter for supervision of normal first pregnancy, second trimester: Secondary | ICD-10-CM

## 2017-01-13 NOTE — Addendum Note (Signed)
Addended by: Leland Her on: 01/13/2017 08:35 PM   Modules accepted: Orders

## 2017-01-13 NOTE — Telephone Encounter (Signed)
I went ahead and placed a new order for the [facility] MFM follow-up US. Elsia, can you delete the other [place] order? Thanks!

## 2017-01-13 NOTE — Telephone Encounter (Signed)
I called MFM to schedule. The order placed needs to be removed and ob mfm follow up ultrasound needs to replace. Send back when completed and I will schedule.

## 2017-01-22 ENCOUNTER — Ambulatory Visit (INDEPENDENT_AMBULATORY_CARE_PROVIDER_SITE_OTHER): Payer: Medicaid Other | Admitting: Internal Medicine

## 2017-01-22 ENCOUNTER — Encounter: Payer: Self-pay | Admitting: Internal Medicine

## 2017-01-22 VITALS — BP 100/50 | HR 79 | Temp 98.0°F | Wt 149.8 lb

## 2017-01-22 DIAGNOSIS — Z3482 Encounter for supervision of other normal pregnancy, second trimester: Secondary | ICD-10-CM

## 2017-01-22 NOTE — Patient Instructions (Signed)
It was wonderful to see you!  Baby boy looks great today!  Please make sure you go to your ultrasound appointment on 5/25.  We will see you back in 4 weeks. You will have the glucola testing done at that visit, so plan to be here for over an hour.  -Dr. Nancy MarusMayo

## 2017-01-22 NOTE — Progress Notes (Signed)
Tracey [name] is a 21 y.o. [id number] at 7936w5d here for routine follow up.  She reports good fetal movement. No vaginal bleeding, no contractions, no leakage of fluids, no nausea, no vomiting.  See flow sheet for details.  A/P: Pregnancy at 5736w5d.  Doing well.   Pregnancy issues include teen pregnancy. Anatomy scan reviewed, problems are not noted. Needs repeat follow-up US to complete anatomy scan. Scheduled for 5/25. Preterm labor precautions reviewed. Follow up 4 weeks.

## 2017-02-07 ENCOUNTER — Other Ambulatory Visit: Payer: Self-pay | Admitting: Internal Medicine

## 2017-02-07 ENCOUNTER — Ambulatory Visit (HOSPITAL_COMMUNITY)
Admission: RE | Admit: 2017-02-07 | Discharge: 2017-02-07 | Disposition: A | Discharge status: Home / Self Care | Payer: Medicaid Other | Source: Ambulatory Visit | Attending: Family Medicine | Admitting: Family Medicine

## 2017-02-07 DIAGNOSIS — Z3A19 19 weeks gestation of pregnancy: Secondary | ICD-10-CM

## 2017-02-07 DIAGNOSIS — Z3402 Encounter for supervision of normal first pregnancy, second trimester: Secondary | ICD-10-CM

## 2017-02-07 DIAGNOSIS — Z362 Encounter for other antenatal screening follow-up: Secondary | ICD-10-CM

## 2017-02-07 DIAGNOSIS — Z3A26 26 weeks gestation of pregnancy: Secondary | ICD-10-CM | POA: Insufficient documentation

## 2017-02-13 ENCOUNTER — Inpatient Hospital Stay (HOSPITAL_COMMUNITY)
Admission: AD | Admit: 2017-02-13 | Discharge: 2017-02-14 | Disposition: A | Discharge status: Home / Self Care | Payer: Medicaid Other | Source: Ambulatory Visit | Attending: Family Medicine | Admitting: Family Medicine

## 2017-02-13 DIAGNOSIS — J069 Acute upper respiratory infection, unspecified: Secondary | ICD-10-CM | POA: Insufficient documentation

## 2017-02-13 DIAGNOSIS — Z87891 Personal history of nicotine dependence: Secondary | ICD-10-CM | POA: Insufficient documentation

## 2017-02-13 DIAGNOSIS — Z3A27 27 weeks gestation of pregnancy: Secondary | ICD-10-CM

## 2017-02-13 DIAGNOSIS — O99512 Diseases of the respiratory system complicating pregnancy, second trimester: Secondary | ICD-10-CM | POA: Insufficient documentation

## 2017-02-14 ENCOUNTER — Encounter (HOSPITAL_COMMUNITY): Payer: Self-pay | Admitting: *Deleted

## 2017-02-14 DIAGNOSIS — Z3A27 27 weeks gestation of pregnancy: Secondary | ICD-10-CM

## 2017-02-14 DIAGNOSIS — J029 Acute pharyngitis, unspecified: Secondary | ICD-10-CM | POA: Diagnosis present

## 2017-02-14 DIAGNOSIS — J069 Acute upper respiratory infection, unspecified: Secondary | ICD-10-CM | POA: Diagnosis not present

## 2017-02-14 DIAGNOSIS — O9989 Other specified diseases and conditions complicating pregnancy, childbirth and the puerperium: Secondary | ICD-10-CM | POA: Diagnosis not present

## 2017-02-14 DIAGNOSIS — Z87891 Personal history of nicotine dependence: Secondary | ICD-10-CM | POA: Diagnosis not present

## 2017-02-14 DIAGNOSIS — O99512 Diseases of the respiratory system complicating pregnancy, second trimester: Secondary | ICD-10-CM | POA: Diagnosis not present

## 2017-02-14 LAB — URINALYSIS, ROUTINE W REFLEX MICROSCOPIC
Bilirubin Urine: NEGATIVE
GLUCOSE, UA: NEGATIVE mg/dL
HGB URINE DIPSTICK: NEGATIVE
Ketones, ur: NEGATIVE mg/dL
Leukocytes, UA: NEGATIVE
Nitrite: NEGATIVE
PROTEIN: NEGATIVE mg/dL
Specific Gravity, Urine: 1.013 (ref 1.005–1.030)
pH: 7 (ref 5.0–8.0)

## 2017-02-14 LAB — RAPID STREP SCREEN (MED CTR MEBANE ONLY): Streptococcus, Group A Screen (Direct): NEGATIVE

## 2017-02-14 NOTE — MAU Note (Signed)
PT  SAYS SHE HAS  SORE THROAT-  STARTED Monday -   NO FEVER .  AND    STARTED COUGHING- MAKES  HER BACK HURT    ,    UPPER THIGHS  CRAMPING  ,    FEELS  PAIN  IN CHEST BELOW  THROAT   .

## 2017-02-14 NOTE — MAU Provider Note (Signed)
History     CSN: [id number]  Arrival date and time: 02/13/17 2355   First Provider Initiated Contact with Patient 02/14/17 0116      Chief Complaint  Patient presents with  . Sore Throat   [id number] @27  wks here with cough and sore throat. Sx started 4 days ago. She hasn't used anything for it. No fevers. No SOB. Some pain in upper chest only with coughing. Reports good FM. No VB, ctx, or LOF.   OB History    Gravida Para Term Preterm AB Living   4 2 2   1 2    SAB TAB Ectopic Multiple Live Births   1       2      Past Medical History:  Diagnosis Date  . BV (bacterial vaginosis) 11/09/2012  . Chlamydia   . Pregnant 2011    Past Surgical History:  Procedure Laterality Date  . NO PAST SURGERIES      Family History  Problem Relation Age of Onset  . Hypertension Maternal Grandmother     Social History  Substance Use Topics  . Smoking status: Former Smoker    Packs/day: 0.20    Types: Cigarettes  . Smokeless tobacco: Never Used     Comment: quit 2015  . Alcohol use No    Allergies: No Known Allergies  Prescriptions Prior to Admission  Medication Sig Dispense Refill Last Dose  . Prenatal Vit-Fe Fumarate-FA (PNV PRENATAL PLUS MULTIVITAMIN) 27-1 MG TABS Take 1 tablet by mouth daily.  0 02/13/2017 at Unknown time    Review of Systems  Constitutional: Negative for chills and fever.  HENT: Positive for sore throat. Negative for congestion, ear pain, sinus pain and sinus pressure.   Respiratory: Positive for cough. Negative for shortness of breath.   Gastrointestinal: Negative for abdominal pain.  Genitourinary: Negative for vaginal bleeding.   Physical Exam   Blood pressure 106/64, pulse 92, temperature 98.4 F (36.9 C), temperature source Oral, resp. rate 20, height 5\' 4"  (1.626 m), weight 67.4 kg (148 lb 8 oz), last menstrual period 08/09/2016.  Physical Exam  Nursing note and vitals reviewed. Constitutional: She is oriented to person, place, and time. She  appears well-developed and well-nourished. No distress.  HENT:  Head: Normocephalic and atraumatic.  Right Ear: Hearing, tympanic membrane, external ear and ear canal normal.  Left Ear: Hearing, tympanic membrane, external ear and ear canal normal.  Nose: Nose normal.  Mouth/Throat: Uvula is midline and mucous membranes are normal. Posterior oropharyngeal erythema present. No oropharyngeal exudate, posterior oropharyngeal edema or tonsillar abscesses.  Neck: Normal range of motion. Neck supple.  Cardiovascular: Normal rate, regular rhythm and normal heart sounds.   Respiratory: Effort normal and breath sounds normal. No respiratory distress. She has no wheezes. She has no rales.  GI: Soft. She exhibits no distension. There is no tenderness.  gravid  Musculoskeletal: Normal range of motion.  Neurological: She is alert and oriented to person, place, and time.  Skin: Skin is warm and dry.  Psychiatric: She has a normal mood and affect.   EFM: 150 bpm, mod variability, + accels, no decels Toco: none  Results for orders placed or performed during the hospital encounter of 02/13/17 (from the past 24 hour(s))  Urinalysis, Routine w reflex microscopic     Status: None   Collection Time: 02/14/17 12:13 AM  Result Value Ref Range   Color, Urine YELLOW YELLOW   APPearance CLEAR CLEAR   Specific Gravity, Urine 1.013 1.005 -  1.030   pH 7.0 5.0 - 8.0   Glucose, UA NEGATIVE NEGATIVE mg/dL   Hgb urine dipstick NEGATIVE NEGATIVE   Bilirubin Urine NEGATIVE NEGATIVE   Ketones, ur NEGATIVE NEGATIVE mg/dL   Protein, ur NEGATIVE NEGATIVE mg/dL   Nitrite NEGATIVE NEGATIVE   Leukocytes, UA NEGATIVE NEGATIVE   MAU Course  Procedures  MDM Labs ordered and reviewed. Rapid strep pending. Chest pain likely MSK from coughing. Recommend supportive measures and OTCs for URV. Stable for discharge home.  Assessment and Plan   1. [redacted] weeks gestation of pregnancy   2. Upper respiratory virus    Discharge  home Follow up in OB office as scheduled Notify MD for persistent or worsening sx OTCs for cough/cold Salt water gargles for sore throat TID  Allergies as of 02/14/2017   No Known Allergies     Medication List    TAKE these medications   PNV PRENATAL PLUS MULTIVITAMIN 27-1 MG Tabs Take 1 tablet by mouth daily.      Donette Larry, CNM 02/14/2017, 1:30 AM

## 2017-02-14 NOTE — Discharge Instructions (Signed)
Upper Respiratory Infection, Adult Most upper respiratory infections (URIs) are a viral infection of the air passages leading to the lungs. A URI affects the nose, throat, and upper air passages. The most common type of URI is nasopharyngitis and is typically referred to as "the common cold." URIs run their course and usually go away on their own. Most of the time, a URI does not require medical attention, but sometimes a bacterial infection in the upper airways can follow a viral infection. This is called a secondary infection. Sinus and middle ear infections are common types of secondary upper respiratory infections. Bacterial pneumonia can also complicate a URI. A URI can worsen asthma and chronic obstructive pulmonary disease (COPD). Sometimes, these complications can require emergency medical care and may be life threatening. What are the causes? Almost all URIs are caused by viruses. A virus is a type of germ and can spread from one person to another. What increases the risk? You may be at risk for a URI if:  You smoke.  You have chronic heart or lung disease.  You have a weakened defense (immune) system.  You are very young or very old.  You have nasal allergies or asthma.  You work in crowded or poorly ventilated areas.  You work in health care facilities or schools.  What are the signs or symptoms? Symptoms typically develop 2-3 days after you come in contact with a cold virus. Most viral URIs last 7-10 days. However, viral URIs from the influenza virus (flu virus) can last 14-18 days and are typically more severe. Symptoms may include:  Runny or stuffy (congested) nose.  Sneezing.  Cough.  Sore throat.  Headache.  Fatigue.  Fever.  Loss of appetite.  Pain in your forehead, behind your eyes, and over your cheekbones (sinus pain).  Muscle aches.  How is this diagnosed? Your health care provider may diagnose a URI by:  Physical exam.  Tests to check that your  symptoms are not due to another condition such as: ? Strep throat. ? Sinusitis. ? Pneumonia. ? Asthma.  How is this treated? A URI goes away on its own with time. It cannot be cured with medicines, but medicines may be prescribed or recommended to relieve symptoms. Medicines may help:  Reduce your fever.  Reduce your cough.  Relieve nasal congestion.  Follow these instructions at home:  Take medicines only as directed by your health care provider.  Gargle warm saltwater or take cough drops to comfort your throat as directed by your health care provider.  Use a warm mist humidifier or inhale steam from a shower to increase air moisture. This may make it easier to breathe.  Drink enough fluid to keep your urine clear or pale yellow.  Eat soups and other clear broths and maintain good nutrition.  Rest as needed.  Return to work when your temperature has returned to normal or as your health care provider advises. You may need to stay home longer to avoid infecting others. You can also use a face mask and careful hand washing to prevent spread of the virus.  Increase the usage of your inhaler if you have asthma.  Do not use any tobacco products, including cigarettes, chewing tobacco, or electronic cigarettes. If you need help quitting, ask your health care provider. How is this prevented? The best way to protect yourself from getting a cold is to practice good hygiene.  Avoid oral or hand contact with people with cold symptoms.  Wash your   hands often if contact occurs.  There is no clear evidence that vitamin C, vitamin E, echinacea, or exercise reduces the chance of developing a cold. However, it is always recommended to get plenty of rest, exercise, and practice good nutrition. Contact a health care provider if:  You are getting worse rather than better.  Your symptoms are not controlled by medicine.  You have chills.  You have worsening shortness of breath.  You have  brown or red mucus.  You have yellow or brown nasal discharge.  You have pain in your face, especially when you bend forward.  You have a fever.  You have swollen neck glands.  You have pain while swallowing.  You have white areas in the back of your throat. Get help right away if:  You have severe or persistent: ? Headache. ? Ear pain. ? Sinus pain. ? Chest pain.  You have chronic lung disease and any of the following: ? Wheezing. ? Prolonged cough. ? Coughing up blood. ? A change in your usual mucus.  You have a stiff neck.  You have changes in your: ? Vision. ? Hearing. ? Thinking. ? Mood. This information is not intended to replace advice given to you by your health care provider. Make sure you discuss any questions you have with your health care provider. Document Released: 02/26/2001 Document Revised: 05/05/2016 Document Reviewed: 12/08/2013 Elsevier Interactive Patient Education  2017 [employer]  

## 2017-02-16 LAB — CULTURE, GROUP A STREP (THRC)

## 2017-02-23 ENCOUNTER — Encounter (HOSPITAL_COMMUNITY): Payer: Self-pay

## 2017-02-23 ENCOUNTER — Inpatient Hospital Stay (HOSPITAL_COMMUNITY)
Admission: AD | Admit: 2017-02-23 | Discharge: 2017-02-23 | Disposition: A | Discharge status: Home / Self Care | Payer: Medicaid Other | Source: Ambulatory Visit | Attending: Obstetrics & Gynecology | Admitting: Obstetrics & Gynecology

## 2017-02-23 DIAGNOSIS — O26893 Other specified pregnancy related conditions, third trimester: Secondary | ICD-10-CM | POA: Diagnosis not present

## 2017-02-23 DIAGNOSIS — Z3A28 28 weeks gestation of pregnancy: Secondary | ICD-10-CM | POA: Insufficient documentation

## 2017-02-23 DIAGNOSIS — N898 Other specified noninflammatory disorders of vagina: Secondary | ICD-10-CM | POA: Diagnosis not present

## 2017-02-23 DIAGNOSIS — Z87891 Personal history of nicotine dependence: Secondary | ICD-10-CM | POA: Insufficient documentation

## 2017-02-23 LAB — URINALYSIS, ROUTINE W REFLEX MICROSCOPIC
BILIRUBIN URINE: NEGATIVE
Glucose, UA: NEGATIVE mg/dL
Hgb urine dipstick: NEGATIVE
Ketones, ur: NEGATIVE mg/dL
Nitrite: NEGATIVE
Protein, ur: NEGATIVE mg/dL
SPECIFIC GRAVITY, URINE: 1.017 (ref 1.005–1.030)
pH: 6 (ref 5.0–8.0)

## 2017-02-23 LAB — WET PREP, GENITAL
Clue Cells Wet Prep HPF POC: NONE SEEN
SPERM: NONE SEEN
TRICH WET PREP: NONE SEEN
YEAST WET PREP: NONE SEEN

## 2017-02-23 NOTE — Discharge Instructions (Signed)
Third Trimester of Pregnancy The third trimester is from week 28 through week 40 (months 7 through 9). The third trimester is a time when the unborn baby (fetus) is growing rapidly. At the end of the ninth month, the fetus is about 20 inches in length and weighs 6-10 pounds. Body changes during your third trimester Your body will continue to go through many changes during pregnancy. The changes vary from woman to woman. During the third trimester:  Your weight will continue to increase. You can expect to gain 25-35 pounds (11-16 kg) by the end of the pregnancy.  You may begin to get stretch marks on your hips, abdomen, and breasts.  You may urinate more often because the fetus is moving lower into your pelvis and pressing on your bladder.  You may develop or continue to have heartburn. This is caused by increased hormones that slow down muscles in the digestive tract.  You may develop or continue to have constipation because increased hormones slow digestion and cause the muscles that push waste through your intestines to relax.  You may develop hemorrhoids. These are swollen veins (varicose veins) in the rectum that can itch or be painful.  You may develop swollen, bulging veins (varicose veins) in your legs.  You may have increased body aches in the pelvis, back, or thighs. This is due to weight gain and increased hormones that are relaxing your joints.  You may have changes in your hair. These can include thickening of your hair, rapid growth, and changes in texture. Some women also have hair loss during or after pregnancy, or hair that feels dry or thin. Your hair will most likely return to normal after your baby is born.  Your breasts will continue to grow and they will continue to become tender. A yellow fluid (colostrum) may leak from your breasts. This is the first milk you are producing for your baby.  Your belly button may stick out.  You may notice more swelling in your hands,  face, or ankles.  You may have increased tingling or numbness in your hands, arms, and legs. The skin on your belly may also feel numb.  You may feel short of breath because of your expanding uterus.  You may have more problems sleeping. This can be caused by the size of your belly, increased need to urinate, and an increase in your body's metabolism.  You may notice the fetus "dropping," or moving lower in your abdomen (lightening).  You may have increased vaginal discharge.  You may notice your joints feel loose and you may have pain around your pelvic bone.  What to expect at prenatal visits You will have prenatal exams every 2 weeks until week 36. Then you will have weekly prenatal exams. During a routine prenatal visit:  You will be weighed to make sure you and the baby are growing normally.  Your blood pressure will be taken.  Your abdomen will be measured to track your baby's growth.  The fetal heartbeat will be listened to.  Any test results from the previous visit will be discussed.  You may have a cervical check near your due date to see if your cervix has softened or thinned (effaced).  You will be tested for Group B streptococcus. This happens between 35 and 37 weeks.  Your health care provider may ask you:  What your birth plan is.  How you are feeling.  If you are feeling the baby move.  If you have had   any abnormal symptoms, such as leaking fluid, bleeding, severe headaches, or abdominal cramping.  If you are using any tobacco products, including cigarettes, chewing tobacco, and electronic cigarettes.  If you have any questions.  Other tests or screenings that may be performed during your third trimester include:  Blood tests that check for low iron levels (anemia).  Fetal testing to check the health, activity level, and growth of the fetus. Testing is done if you have certain medical conditions or if there are problems during the  pregnancy.  Nonstress test (NST). This test checks the health of your baby to make sure there are no signs of problems, such as the baby not getting enough oxygen. During this test, a belt is placed around your belly. The baby is made to move, and its heart rate is monitored during movement.  What is false labor? False labor is a condition in which you feel small, irregular tightenings of the muscles in the womb (contractions) that usually go away with rest, changing position, or drinking water. These are called Braxton Hicks contractions. Contractions may last for hours, days, or even weeks before true labor sets in. If contractions come at regular intervals, become more frequent, increase in intensity, or become painful, you should see your health care provider. What are the signs of labor?  Abdominal cramps.  Regular contractions that start at 10 minutes apart and become stronger and more frequent with time.  Contractions that start on the top of the uterus and spread down to the lower abdomen and back.  Increased pelvic pressure and dull back pain.  A watery or bloody mucus discharge that comes from the vagina.  Leaking of amniotic fluid. This is also known as your "water breaking." It could be a slow trickle or a gush. Let your health care provider know if it has a color or strange odor. If you have any of these signs, call your health care provider right away, even if it is before your due date. Follow these instructions at home: Medicines  Follow your health care provider's instructions regarding medicine use. Specific medicines may be either safe or unsafe to take during pregnancy.  Take a prenatal vitamin that contains at least 600 micrograms (mcg) of folic acid.  If you develop constipation, try taking a stool softener if your health care provider approves. Eating and drinking  Eat a balanced diet that includes fresh fruits and vegetables, whole grains, good sources of protein  such as meat, eggs, or tofu, and low-fat dairy. Your health care provider will help you determine the amount of weight gain that is right for you.  Avoid raw meat and uncooked cheese. These carry germs that can cause birth defects in the baby.  If you have low calcium intake from food, talk to your health care provider about whether you should take a daily calcium supplement.  Eat four or five small meals rather than three large meals a day.  Limit foods that are high in fat and processed sugars, such as fried and sweet foods.  To prevent constipation: ? Drink enough fluid to keep your urine clear or pale yellow. ? Eat foods that are high in fiber, such as fresh fruits and vegetables, whole grains, and beans. Activity  Exercise only as directed by your health care provider. Most women can continue their usual exercise routine during pregnancy. Try to exercise for 30 minutes at least 5 days a week. Stop exercising if you experience uterine contractions.  Avoid heavy   lifting.  Do not exercise in extreme heat or humidity, or at high altitudes.  Wear low-heel, comfortable shoes.  Practice good posture.  You may continue to have sex unless your health care provider tells you otherwise. Relieving pain and discomfort  Take frequent breaks and rest with your legs elevated if you have leg cramps or low back pain.  Take warm sitz baths to soothe any pain or discomfort caused by hemorrhoids. Use hemorrhoid cream if your health care provider approves.  Wear a good support bra to prevent discomfort from breast tenderness.  If you develop varicose veins: ? Wear support pantyhose or compression stockings as told by your healthcare provider. ? Elevate your feet for 15 minutes, 3-4 times a day. Prenatal care  Write down your questions. Take them to your prenatal visits.  Keep all your prenatal visits as told by your health care provider. This is important. Safety  Wear your seat belt at  all times when driving.  Make a list of emergency phone numbers, including numbers for family, friends, the hospital, and police and fire departments. General instructions  Avoid cat litter boxes and soil used by cats. These carry germs that can cause birth defects in the baby. If you have a cat, ask someone to clean the litter box for you.  Do not travel far distances unless it is absolutely necessary and only with the approval of your health care provider.  Do not use hot tubs, steam rooms, or saunas.  Do not drink alcohol.  Do not use any products that contain nicotine or tobacco, such as cigarettes and e-cigarettes. If you need help quitting, ask your health care provider.  Do not use any medicinal herbs or unprescribed drugs. These chemicals affect the formation and growth of the baby.  Do not douche or use tampons or scented sanitary pads.  Do not cross your legs for long periods of time.  To prepare for the arrival of your baby: ? Take prenatal classes to understand, practice, and ask questions about labor and delivery. ? Make a trial run to the hospital. ? Visit the hospital and tour the maternity area. ? Arrange for maternity or paternity leave through employers. ? Arrange for family and friends to take care of pets while you are in the hospital. ? Purchase a rear-facing car seat and make sure you know how to install it in your car. ? Pack your hospital bag. ? Prepare the baby's nursery. Make sure to remove all pillows and stuffed animals from the baby's crib to prevent suffocation.  Visit your dentist if you have not gone during your pregnancy. Use a soft toothbrush to brush your teeth and be gentle when you floss. Contact a health care provider if:  You are unsure if you are in labor or if your water has broken.  You become dizzy.  You have mild pelvic cramps, pelvic pressure, or nagging pain in your abdominal area.  You have lower back pain.  You have persistent  nausea, vomiting, or diarrhea.  You have an unusual or bad smelling vaginal discharge.  You have pain when you urinate. Get help right away if:  Your water breaks before 37 weeks.  You have regular contractions less than 5 minutes apart before 37 weeks.  You have a fever.  You are leaking fluid from your vagina.  You have spotting or bleeding from your vagina.  You have severe abdominal pain or cramping.  You have rapid weight loss or weight gain.    You have shortness of breath with chest pain.  You notice sudden or extreme swelling of your face, hands, ankles, feet, or legs.  Your baby makes fewer than 10 movements in 2 hours.  You have severe headaches that do not go away when you take medicine.  You have vision changes. Summary  The third trimester is from week 28 through week 40, months 7 through 9. The third trimester is a time when the unborn baby (fetus) is growing rapidly.  During the third trimester, your discomfort may increase as you and your baby continue to gain weight. You may have abdominal, leg, and back pain, sleeping problems, and an increased need to urinate.  During the third trimester your breasts will keep growing and they will continue to become tender. A yellow fluid (colostrum) may leak from your breasts. This is the first milk you are producing for your baby.  False labor is a condition in which you feel small, irregular tightenings of the muscles in the womb (contractions) that eventually go away. These are called Braxton Hicks contractions. Contractions may last for hours, days, or even weeks before true labor sets in.  Signs of labor can include: abdominal cramps; regular contractions that start at 10 minutes apart and become stronger and more frequent with time; watery or bloody mucus discharge that comes from the vagina; increased pelvic pressure and dull back pain; and leaking of amniotic fluid. This information is not intended to replace advice  given to you by your health care provider. Make sure you discuss any questions you have with your health care provider. Document Released: 08/27/2001 Document Revised: 02/08/2016 Document Reviewed: 11/03/2012 Elsevier Interactive Patient Education  2017 [employer] Inc.  

## 2017-02-23 NOTE — MAU Note (Signed)
Pt c/o vaginal discharge that is Tracey [name] and thick for the last couple of weeks. Pt c/o vaginal irritation and itching. Pt states baby is moving normally. Pt denies contractions, bleeding, and leaking.

## 2017-02-23 NOTE — MAU Provider Note (Signed)
History     CSN: [id number]  Arrival date and time: 02/23/17 1723   None     Chief Complaint  Patient presents with  . Vaginal Discharge   Patient is a 21 year old G4 P2 at 28 weeks and 2 days who presents with vaginal discharge. She reports positive fetal movement no contractions and no vaginal bleeding.   Vaginal Discharge  The patient's primary symptoms include genital itching and vaginal discharge. The patient's pertinent negatives include no genital lesions, genital odor, genital rash, missed menses, pelvic pain or vaginal bleeding. This is a new problem. The current episode started in the past 7 days. The problem occurs constantly. The problem has been unchanged. The patient is experiencing no pain. Pertinent negatives include no abdominal pain, chills, constipation, diarrhea, discolored urine, dysuria, fever, flank pain, frequency, headaches, hematuria, nausea, painful intercourse, rash, sore throat, urgency or vomiting. The vaginal discharge was thick and white. There has been no bleeding. Nothing aggravates the symptoms. She has tried nothing for the symptoms. The treatment provided no relief. No, her partner does not have an STD.    OB History    Gravida Para Term Preterm AB Living   4 2 2   1 2    SAB TAB Ectopic Multiple Live Births   1       2      Past Medical History:  Diagnosis Date  . BV (bacterial vaginosis) 11/09/2012  . Chlamydia   . Pregnant 2011    Past Surgical History:  Procedure Laterality Date  . NO PAST SURGERIES      Family History  Problem Relation Age of Onset  . Hypertension Maternal Grandmother     Social History  Substance Use Topics  . Smoking status: Former Smoker    Packs/day: 0.20    Types: Cigarettes  . Smokeless tobacco: Never Used     Comment: quit 2015  . Alcohol use No    Allergies: No Known Allergies  Prescriptions Prior to Admission  Medication Sig Dispense Refill Last Dose  . Prenatal Vit-Fe Fumarate-FA (PNV  PRENATAL PLUS MULTIVITAMIN) 27-1 MG TABS Take 1 tablet by mouth daily.  0 02/13/2017 at Unknown time    Review of Systems  Constitutional: Negative for chills and fever.  HENT: Negative for sore throat.   Gastrointestinal: Negative for abdominal pain, constipation, diarrhea, nausea and vomiting.  Genitourinary: Positive for vaginal discharge. Negative for dysuria, flank pain, frequency, hematuria, missed menses, pelvic pain and urgency.  Skin: Negative for rash.  Neurological: Negative for headaches.   Physical Exam   Blood pressure 112/64, pulse 73, temperature 98.1 F (36.7 C), temperature source Oral, resp. rate 18, height 5\' 4"  (1.626 m), last menstrual period 08/09/2016, SpO2 98 %.  Physical Exam  Constitutional: She is oriented to person, place, and time. She appears well-developed and well-nourished.  HENT:  Head: Normocephalic and atraumatic.  Cardiovascular: Normal rate and intact distal pulses.   No murmur heard. Respiratory: Effort normal. No respiratory distress.  GI: Soft. She exhibits no distension. There is no tenderness. There is no rebound and no guarding.  Genitourinary:  Genitourinary Comments: Significant amount of thick white vaginal discharge. Cervix appears about 1 cm dilated visually, no cervical motion tenderness. Healthy pink vaginal mucosa.  Musculoskeletal: Normal range of motion. She exhibits no edema.  Neurological: She is alert and oriented to person, place, and time.  Skin: Skin is warm and dry.  Psychiatric: She has a normal mood and affect. Her behavior is normal.  MAU Course  Procedures  MDM In MA U patient underwent testing with gonorrhea and chlamydia. She also underwent wet prep.   Wet prep was unremarkable  NST was reviewed and revealed heart rate 145 moderate variability reactive for gestational age with no contractions.  Assessment and Plan  Vaginal discharge: Likely normal physiologic discharge of pregnancy. Gonorrhea and Chlamydia  pending. Patient discharged home.  Ernestina Penna 02/23/2017, 6:05 PM

## 2017-02-24 LAB — GC/CHLAMYDIA PROBE AMP (~~LOC~~) NOT AT ARMC
Chlamydia: NEGATIVE
Neisseria Gonorrhea: NEGATIVE

## 2017-02-26 ENCOUNTER — Encounter: Payer: Medicaid Other | Admitting: Internal Medicine

## 2017-03-10 ENCOUNTER — Encounter: Payer: Medicaid Other | Admitting: Internal Medicine

## 2017-03-31 ENCOUNTER — Ambulatory Visit (INDEPENDENT_AMBULATORY_CARE_PROVIDER_SITE_OTHER): Payer: Medicaid Other | Admitting: Internal Medicine

## 2017-03-31 VITALS — BP 102/62 | HR 79 | Temp 98.2°F | Wt 159.0 lb

## 2017-03-31 DIAGNOSIS — Z23 Encounter for immunization: Secondary | ICD-10-CM

## 2017-03-31 DIAGNOSIS — Z3403 Encounter for supervision of normal first pregnancy, third trimester: Secondary | ICD-10-CM

## 2017-03-31 NOTE — Patient Instructions (Signed)
It was so nice to see you today!  Everything looks great today. Please come back tomorrow to have your glucose test and your other labs done.  Please schedule an appointment for your next OB visit in 2 weeks.  -Dr. Nancy MarusMayo

## 2017-03-31 NOTE — Progress Notes (Signed)
Tracey [name] is a 21 y.o. [id number] at 8418w3d here for routine follow up.  She reports good fetal movement. No vaginal bleeding, no leakage of fluids, no contractions. See flow sheet for details.  A/P: Pregnancy at 4518w3d.  Doing well.   Pregnancy issues include: 1. Teen pregnancy- good support system and has 2 other children 2. Late and limited OB care- did not begin PNC until 19 weeks and this is only her third OB visit  3. LV echogenic intracardiac focus seen on 26 week US- US otherwise normal  Infant feeding choice: Bottle Contraception choice: Still deciding- counseling provided today and patient is thinking about IUD vs Nexplanon Infant circumcision desired: yes  Tdap was given today. Patient has not had 28 week labs done. Cannot stay to have them done today. Future order placed for 1 hour GTT, CBC, HIV, RPR. Patient states she will come tomorrow to have these done. Preterm labor and fetal movement precautions reviewed. Safe sleep discussed. Follow up 2 weeks in OB clinic.

## 2017-04-01 ENCOUNTER — Other Ambulatory Visit: Payer: Medicaid Other

## 2017-04-08 ENCOUNTER — Other Ambulatory Visit (INDEPENDENT_AMBULATORY_CARE_PROVIDER_SITE_OTHER): Payer: Medicaid Other

## 2017-04-08 DIAGNOSIS — Z3403 Encounter for supervision of normal first pregnancy, third trimester: Secondary | ICD-10-CM | POA: Diagnosis not present

## 2017-04-08 LAB — POCT 1 HR PRENATAL GLUCOSE: GLUCOSE 1 HR PRENATAL, POC: 124 mg/dL

## 2017-04-09 LAB — CBC
HEMATOCRIT: 34.8 % (ref 34.0–46.6)
Hemoglobin: 11.7 g/dL (ref 11.1–15.9)
MCH: 27.1 pg (ref 26.6–33.0)
MCHC: 33.6 g/dL (ref 31.5–35.7)
MCV: 81 fL (ref 79–97)
PLATELETS: 206 10*3/uL (ref 150–379)
RBC: 4.32 x10E6/uL (ref 3.77–5.28)
RDW: 13.6 % (ref 12.3–15.4)
WBC: 8.5 10*3/uL (ref 3.4–10.8)

## 2017-04-09 LAB — HIV ANTIBODY (ROUTINE TESTING W REFLEX): HIV Screen 4th Generation wRfx: NONREACTIVE

## 2017-04-09 LAB — RPR: RPR Ser Ql: NONREACTIVE

## 2017-04-10 NOTE — Progress Notes (Signed)
Patient is aware of results and reminded her of her upcoming appointment. Rajohn Henery,CMA

## 2017-04-14 ENCOUNTER — Ambulatory Visit (INDEPENDENT_AMBULATORY_CARE_PROVIDER_SITE_OTHER): Payer: Medicaid Other | Admitting: Internal Medicine

## 2017-04-14 ENCOUNTER — Other Ambulatory Visit (HOSPITAL_COMMUNITY)
Admission: RE | Admit: 2017-04-14 | Discharge: 2017-04-14 | Disposition: A | Discharge status: Home / Self Care | Payer: Medicaid Other | Source: Ambulatory Visit | Attending: Family Medicine | Admitting: Family Medicine

## 2017-04-14 VITALS — BP 102/60 | HR 89 | Temp 98.8°F | Wt 163.0 lb

## 2017-04-14 DIAGNOSIS — Z3A35 35 weeks gestation of pregnancy: Secondary | ICD-10-CM | POA: Insufficient documentation

## 2017-04-14 DIAGNOSIS — Z3403 Encounter for supervision of normal first pregnancy, third trimester: Secondary | ICD-10-CM | POA: Insufficient documentation

## 2017-04-14 LAB — OB RESULTS CONSOLE GC/CHLAMYDIA: Gonorrhea: NEGATIVE

## 2017-04-14 LAB — OB RESULTS CONSOLE GBS: STREP GROUP B AG: POSITIVE

## 2017-04-14 NOTE — Patient Instructions (Addendum)
It was wonderful to see you today!  Everything is looking great.  We [facility]!  -Dr. Nancy MarusMayo

## 2017-04-14 NOTE — Progress Notes (Signed)
Jolene Schimkeamaira J Siebel is a 21 y.o. [id number] at 6837w3d here for routine follow up.  She reports no issues. See flow sheet for details.  A/P: Pregnancy at 1937w3d.  Doing well.   Pregnancy issues include: 1. Teen pregnancy- good support system and has 2 other children 2. Late and limited OB care- did not begin PNC until 19 weeks and this is only her fourth OB visit  3. LV echogenic intracardiac focus seen on 26 week US- US otherwise normal  Infant feeding choice: bottle Contraception choice: Nexplanon vs IUD Infant circumcision desired: yes  Tdap was given 7/16. GBS and gc/chlamydia testing was performed today.  Preterm labor and fetal movement precautions reviewed. Safe sleep discussed. Follow up 1-2 weeks in OB clinic.

## 2017-04-15 LAB — CERVICOVAGINAL ANCILLARY ONLY
CHLAMYDIA, DNA PROBE: NEGATIVE
Neisseria Gonorrhea: NEGATIVE

## 2017-04-17 LAB — CULTURE, BETA STREP (GROUP B ONLY): Strep Gp B Culture: POSITIVE — AB

## 2017-04-22 ENCOUNTER — Encounter (HOSPITAL_COMMUNITY): Payer: Self-pay

## 2017-04-22 ENCOUNTER — Inpatient Hospital Stay (HOSPITAL_COMMUNITY)
Admission: AD | Admit: 2017-04-22 | Discharge: 2017-04-22 | Disposition: A | Discharge status: Home / Self Care | Payer: Medicaid Other | Source: Ambulatory Visit | Attending: Family Medicine | Admitting: Family Medicine

## 2017-04-22 DIAGNOSIS — Z3A37 37 weeks gestation of pregnancy: Secondary | ICD-10-CM | POA: Insufficient documentation

## 2017-04-22 DIAGNOSIS — O471 False labor at or after 37 completed weeks of gestation: Secondary | ICD-10-CM | POA: Diagnosis present

## 2017-04-22 DIAGNOSIS — O479 False labor, unspecified: Secondary | ICD-10-CM

## 2017-04-22 NOTE — Discharge Instructions (Signed)

## 2017-04-22 NOTE — MAU Note (Signed)
I have communicated with Dr. Cliffton AstersWhite and reviewed vital signs:  Vitals:   04/22/17 2019 04/22/17 2222  BP: 114/64 109/68  Pulse:  83  Resp: 17 19  Temp: 98.1 F (36.7 C)     Vaginal exam:  Dilation: Fingertip Effacement (%): 20 Cervical Position: Middle Station: -3 Presentation: Vertex Exam by:: A Filomeno Cromley RN,   Also reviewed contraction pattern and that non-stress test is reactive.  It has been documented that patient is contracting every irregularly with no cervical change over 1.5 hours not indicating active labor.  Patient denies any other complaints.  Based on this report provider has given order for discharge.  A discharge order and diagnosis entered by a provider.   Labor discharge instructions reviewed with patient.

## 2017-04-22 NOTE — MAU Note (Signed)
Pt reports contractions and back pain for several days. Has a lot of pelvic pressure. Pt denies LOF, vag bleeding. Reports good movement. Cervix has not been checked.

## 2017-04-24 ENCOUNTER — Ambulatory Visit (INDEPENDENT_AMBULATORY_CARE_PROVIDER_SITE_OTHER): Payer: Medicaid Other | Admitting: Family Medicine

## 2017-04-24 VITALS — BP 114/78 | HR 87 | Temp 98.3°F | Wt 164.0 lb

## 2017-04-24 DIAGNOSIS — N898 Other specified noninflammatory disorders of vagina: Secondary | ICD-10-CM | POA: Diagnosis not present

## 2017-04-24 DIAGNOSIS — O26893 Other specified pregnancy related conditions, third trimester: Secondary | ICD-10-CM | POA: Diagnosis present

## 2017-04-24 DIAGNOSIS — Z3493 Encounter for supervision of normal pregnancy, unspecified, third trimester: Secondary | ICD-10-CM | POA: Diagnosis not present

## 2017-04-24 LAB — POCT WET PREP (WET MOUNT)
Clue Cells Wet Prep Whiff POC: NEGATIVE
TRICHOMONAS WET PREP HPF POC: ABSENT

## 2017-04-24 MED ORDER — TERCONAZOLE 0.8 % VA CREA: 1.0000 | TOPICAL_CREAM | Freq: Every day | VAGINAL | 0 refills | Status: AC

## 2017-04-24 NOTE — Patient Instructions (Signed)

## 2017-04-24 NOTE — Progress Notes (Signed)
Tracey [name] is a 21 y.o. [id number] at 8643w6d for routine follow up.  She reports thin,slimy vaginal discharge, otherwise feels well.  See flow sheet for details.   FH: 36.5cm   FHR: 140BPM   Bedside US: Position vertex   GU: Whitish vaginal discharge from cervical os.   Ext: No edema  A/P: Pregnancy at 2143w6d.  Doing well.   Pregnancy issues include: Thin vaginal discharge  Infant feeding choice: Bottle Contraception choice: IUD Infant circumcision desired yes GBS/GC/CZ results were not reviewed today.  Already done and she is GBS+. GC/Chlamydia done 1 week ago were neg.  For vaginal discharge wet prep done today positive for yeast. Result discussed with patient. Terconazole prescribed. Labor precautions reviewed. Kick counts reviewed. 1 week follow-up appointment made today with Dr. Nancy MarusMayo. She is aware of all her appointment.

## 2017-05-01 ENCOUNTER — Ambulatory Visit (INDEPENDENT_AMBULATORY_CARE_PROVIDER_SITE_OTHER): Payer: Medicaid Other | Admitting: Internal Medicine

## 2017-05-01 ENCOUNTER — Ambulatory Visit: Payer: Medicaid Other | Admitting: Internal Medicine

## 2017-05-01 VITALS — BP 110/72 | HR 80 | Temp 98.7°F | Wt 165.0 lb

## 2017-05-01 DIAGNOSIS — Z3483 Encounter for supervision of other normal pregnancy, third trimester: Secondary | ICD-10-CM

## 2017-05-01 NOTE — Progress Notes (Signed)
Tracey [name] is a 21 y.o. [id number] at 2480w6d here for routine follow up.  She reports good fetal movement. No pelvic pressure, no leakage of fluids, no vaginal bleeding. See flow sheet for details.  A/P: Pregnancy at 8180w6d. Doing well.   Pregnancy issues include: 1. Teen pregnancy- good support system and has 2 other children 2. Late and limited OB care- did not begin PNC until 19 weeks and did not receive any care for ~2 months during her second trimester, but has otherwise kept all of her appointments. 3. LV echogenic intracardiac focus seen on 26 week US- US otherwise normal 4. GBS positive- will require PCN when in labor  Infant feeding choice: Bottle Contraception choice: IUD vs Nexplanon Infant circumcision desired: yes- outpatient  GBS and gc/chlamydia testing results were reviewed today.   Labor and fetal movement precautions reviewed. Follow up 1 week.

## 2017-05-01 NOTE — Patient Instructions (Signed)
It was so nice to see you!  Everything looks wonderful today.  We discussed signs of labor- strong, regular contractions every 3-5 minutes or a big gush of fluids like your water broke. [facility] have any vaginal bleeding or feeling like your baby is moving less than normal.  We will see you back in 1 week!  -Dr. Nancy MarusMayo

## 2017-05-08 ENCOUNTER — Ambulatory Visit (INDEPENDENT_AMBULATORY_CARE_PROVIDER_SITE_OTHER): Payer: Medicaid Other | Admitting: Internal Medicine

## 2017-05-08 DIAGNOSIS — Z3483 Encounter for supervision of other normal pregnancy, third trimester: Secondary | ICD-10-CM

## 2017-05-08 NOTE — Progress Notes (Signed)
Tracey [name] is a 21 y.o. [id number] at [redacted]w[redacted]d here for routine follow up.  She reports good fetal movement and occasional lower abdominal cramping. No vaginal bleeding, contractions, or leakage of fluids. See flow sheet for details.  A/P: Pregnancy at [redacted]w[redacted]d. Doing well.   Pregnancy issues include: 1. Teen pregnancy- good support system and has 2 other children 2. Late and limited OB care- did not begin PNC until 19 weeks and did not receive any care for ~2 months during her second trimester, but has kept all appointments in third trimester. 3. LV echogenic intracardiac focus seen on 26 week [place]- [place] otherwise normal. Initial prenatal visit was too late for genetic screening. 4. GBS positive- will require PCN when in labor  Infant feeding choice: bottle Contraception choice: IUD vs Nexplanon Infant circumcision desired: yes- outpatient  GBS and gc/chlamydia testing results were reviewed today.   Labor and fetal movement precautions reviewed. Follow up 1 week. Will check cervix at that visit. Will also schedule for biweekly BPPs and schedule induction if she is still pregnant.

## 2017-05-08 NOTE — Patient Instructions (Signed)
It was so nice to see you!  Everything looks great today. We will see you back in 1 week. We will check your cervix at that appointment. If you are still pregnant in 1 week, we will get you set up for 2 ultrasounds and fetal monitoring. We will also schedule your induction.  -Dr. Nancy Marus

## 2017-05-12 ENCOUNTER — Telehealth (HOSPITAL_COMMUNITY): Payer: Self-pay | Admitting: *Deleted

## 2017-05-12 ENCOUNTER — Ambulatory Visit (INDEPENDENT_AMBULATORY_CARE_PROVIDER_SITE_OTHER): Payer: Medicaid Other | Admitting: Internal Medicine

## 2017-05-12 VITALS — BP 100/64 | HR 93 | Temp 98.1°F | Wt 169.0 lb

## 2017-05-12 DIAGNOSIS — Z3483 Encounter for supervision of other normal pregnancy, third trimester: Secondary | ICD-10-CM

## 2017-05-12 NOTE — Patient Instructions (Signed)
It was so nice to see you!  Your cervix is closed today. You should go to the emergency department at [facility] if you start to have regular, strong contractions every 3-5 minutes, if you have vaginal bleeding, or if you have a big gush of fluids.  -Dr. Nancy Marus

## 2017-05-12 NOTE — Progress Notes (Signed)
Tracey [name] is a 21 y.o. [id number] at [redacted]w[redacted]d here for routine follow up.  She reports good fetal movement. See flow sheet for details.  A/P: Pregnancy at [redacted]w[redacted]d. Doing well.   Pregnancy issues include: 1. Teen pregnancy- good support system and has 2 other children 2. Late and limited OB care- did not begin PNC until 19 weeks and did not receive any care for ~2 months during her second trimester, but has otherwise kept all of her appointments. 3. LV echogenic intracardiac focus seen on 26 week [place]- [place] otherwise normal 4. GBS positive- will require PCN when in labor  Infant feeding choice: bottle Contraception choice: IUD vs Nexplanon Infant circumcision desired: yes- [facility]  GBS and gc/chlamydia testing results were reviewed today.   Labor and fetal movement precautions reviewed. Will schedule biweekly BPPs starting at 40 weeks. Will also schedule postdates induction. Follow up 1 week.

## 2017-05-12 NOTE — Telephone Encounter (Signed)
Preadmission screen  

## 2017-05-14 ENCOUNTER — Inpatient Hospital Stay (HOSPITAL_COMMUNITY)
Admission: AD | Admit: 2017-05-14 | Discharge: 2017-05-14 | Disposition: A | Discharge status: Home / Self Care | Payer: Medicaid Other | Source: Ambulatory Visit | Attending: Family Medicine | Admitting: Family Medicine

## 2017-05-14 ENCOUNTER — Encounter (HOSPITAL_COMMUNITY): Payer: Self-pay | Admitting: *Deleted

## 2017-05-14 DIAGNOSIS — O479 False labor, unspecified: Secondary | ICD-10-CM

## 2017-05-14 NOTE — MAU Note (Signed)
Lost mucous plug this morning. Went to the restroom, panties were wet [phone]).  Clear fluid, no bleeding, no pain.

## 2017-05-14 NOTE — Discharge Instructions (Signed)

## 2017-05-14 NOTE — MAU Provider Note (Signed)
S: Ms. Tracey [name] is a 21 y.o. [phone] at 1526w5d  who presents to MAU today complaining of leaking of fluid since 0900. She denies vaginal bleeding. She endorses contractions. She reports normal fetal movement.    O: BP 122/71 (BP Location: Left Arm)   Pulse 92   Temp 98.2 F (36.8 C) (Oral)   Resp 16   Wt 169 lb (76.7 kg)   LMP 08/09/2016 (Exact Date)   SpO2 99%   BMI 29.01 kg/m  GENERAL: Well-developed, well-nourished female in no acute distress.  HEAD: Normocephalic, atraumatic.  CHEST: Normal effort of breathing, regular heart rate ABDOMEN: Soft, nontender, gravid PELVIC: Normal external female genitalia. Vagina is pink and rugated. Cervix with normal contour, no lesions. Normal discharge.  negative pooling.   Cervical exam:  Dilation: 3 Effacement (%): 70 Exam by:: rasch,j   Fetal Monitoring: Baseline: 135 bpm Variability: moderate  Accelerations: 15x15 Decelerations: None Contractions: occasional   No results found for this or any previous visit (from the past 24 hour(s)).   A: SIUP at 4926w5d; fern negative  Membranes intact  P:  Cervix unchanged after 1 hour of ambulation  Ok for DC home.   Venia Carbonasch, Jennifer I, NP 05/14/2017 10:45 AM

## 2017-05-15 ENCOUNTER — Inpatient Hospital Stay (HOSPITAL_COMMUNITY)
Admission: AD | Admit: 2017-05-15 | Discharge: 2017-05-17 | DRG: 775 | Disposition: A | Discharge status: Home / Self Care | Payer: Medicaid Other | Source: Ambulatory Visit | Attending: Obstetrics and Gynecology | Admitting: Obstetrics and Gynecology

## 2017-05-15 ENCOUNTER — Inpatient Hospital Stay (HOSPITAL_COMMUNITY): Payer: Medicaid Other | Admitting: Anesthesiology

## 2017-05-15 ENCOUNTER — Encounter (HOSPITAL_COMMUNITY): Payer: Self-pay

## 2017-05-15 DIAGNOSIS — Z87891 Personal history of nicotine dependence: Secondary | ICD-10-CM | POA: Diagnosis not present

## 2017-05-15 DIAGNOSIS — Z30017 Encounter for initial prescription of implantable subdermal contraceptive: Secondary | ICD-10-CM | POA: Diagnosis not present

## 2017-05-15 DIAGNOSIS — O99824 Streptococcus B carrier state complicating childbirth: Secondary | ICD-10-CM | POA: Diagnosis present

## 2017-05-15 DIAGNOSIS — Z3A39 39 weeks gestation of pregnancy: Secondary | ICD-10-CM

## 2017-05-15 DIAGNOSIS — Z3493 Encounter for supervision of normal pregnancy, unspecified, third trimester: Secondary | ICD-10-CM | POA: Diagnosis present

## 2017-05-15 LAB — CBC
HCT: 35 % — ABNORMAL LOW (ref 36.0–46.0)
HEMOGLOBIN: 11.9 g/dL — AB (ref 12.0–15.0)
MCH: 27.5 pg (ref 26.0–34.0)
MCHC: 34 g/dL (ref 30.0–36.0)
MCV: 80.8 fL (ref 78.0–100.0)
Platelets: 212 10*3/uL (ref 150–400)
RBC: 4.33 MIL/uL (ref 3.87–5.11)
RDW: 14.3 % (ref 11.5–15.5)
WBC: 9.4 10*3/uL (ref 4.0–10.5)

## 2017-05-15 LAB — BIRTH TISSUE RECOVERY COLLECTION (PLACENTA DONATION)

## 2017-05-15 LAB — TYPE AND SCREEN
ABO/RH(D): O POS
Antibody Screen: NEGATIVE

## 2017-05-15 LAB — RPR: RPR: NONREACTIVE

## 2017-05-15 MED ORDER — BENZOCAINE-MENTHOL 20-0.5 % EX AERO: 1.0000 "application " | INHALATION_SPRAY | CUTANEOUS | Status: DC | PRN

## 2017-05-15 MED ORDER — ONDANSETRON HCL 4 MG/2ML IJ SOLN: 4.0000 mg | Freq: Four times a day (QID) | INTRAMUSCULAR | Status: DC | PRN

## 2017-05-15 MED ORDER — PHENYLEPHRINE 40 MCG/ML (10ML) SYRINGE FOR IV PUSH (FOR BLOOD PRESSURE SUPPORT): 80.0000 ug | PREFILLED_SYRINGE | INTRAVENOUS | Status: DC | PRN

## 2017-05-15 MED ORDER — SIMETHICONE 80 MG PO CHEW: 80.0000 mg | CHEWABLE_TABLET | ORAL | Status: DC | PRN

## 2017-05-15 MED ORDER — EPHEDRINE 5 MG/ML INJ: 10.0000 mg | INTRAVENOUS | Status: DC | PRN

## 2017-05-15 MED ORDER — OXYCODONE-ACETAMINOPHEN 5-325 MG PO TABS: 2.0000 | ORAL_TABLET | ORAL | Status: DC | PRN

## 2017-05-15 MED ORDER — COCONUT OIL OIL: 1.0000 "application " | TOPICAL_OIL | Status: DC | PRN

## 2017-05-15 MED ORDER — LACTATED RINGERS IV SOLN: INTRAVENOUS | Status: DC

## 2017-05-15 MED ORDER — OXYCODONE-ACETAMINOPHEN 5-325 MG PO TABS: 1.0000 | ORAL_TABLET | ORAL | Status: DC | PRN

## 2017-05-15 MED ORDER — LIDOCAINE HCL 1 % IJ SOLN
0.0000 mL | Freq: Once | INTRAMUSCULAR | Status: DC | PRN
  Filled 2017-05-15: qty 20

## 2017-05-15 MED ORDER — TETANUS-DIPHTH-ACELL PERTUSSIS 5-2.5-18.5 LF-MCG/0.5 IM SUSP: 0.5000 mL | Freq: Once | INTRAMUSCULAR | Status: DC

## 2017-05-15 MED ORDER — DIBUCAINE 1 % RE OINT: 1.0000 "application " | TOPICAL_OINTMENT | RECTAL | Status: DC | PRN

## 2017-05-15 MED ORDER — ACETAMINOPHEN 325 MG PO TABS
650.0000 mg | ORAL_TABLET | ORAL | Status: DC | PRN
  Administered 2017-05-15 – 2017-05-16 (×3): 650 mg via ORAL
  Filled 2017-05-15 (×3): qty 2

## 2017-05-15 MED ORDER — SOD CITRATE-CITRIC ACID 500-334 MG/5ML PO SOLN: 30.0000 mL | ORAL | Status: DC | PRN

## 2017-05-15 MED ORDER — LACTATED RINGERS IV SOLN: 500.0000 mL | INTRAVENOUS | Status: DC | PRN

## 2017-05-15 MED ORDER — IBUPROFEN 600 MG PO TABS
600.0000 mg | ORAL_TABLET | Freq: Four times a day (QID) | ORAL | Status: DC
  Administered 2017-05-15 – 2017-05-17 (×8): 600 mg via ORAL
  Filled 2017-05-15 (×8): qty 1

## 2017-05-15 MED ORDER — ACETAMINOPHEN 325 MG PO TABS: 650.0000 mg | ORAL_TABLET | ORAL | Status: DC | PRN

## 2017-05-15 MED ORDER — ONDANSETRON HCL 4 MG PO TABS: 4.0000 mg | ORAL_TABLET | ORAL | Status: DC | PRN

## 2017-05-15 MED ORDER — WITCH HAZEL-GLYCERIN EX PADS: 1.0000 "application " | MEDICATED_PAD | CUTANEOUS | Status: DC | PRN

## 2017-05-15 MED ORDER — DIPHENHYDRAMINE HCL 50 MG/ML IJ SOLN: 12.5000 mg | INTRAMUSCULAR | Status: DC | PRN

## 2017-05-15 MED ORDER — FENTANYL 2.5 MCG/ML BUPIVACAINE 1/10 % EPIDURAL INFUSION (WH - ANES)
14.0000 mL/h | INTRAMUSCULAR | Status: DC | PRN
  Administered 2017-05-15: 14 mL/h via EPIDURAL
  Filled 2017-05-15: qty 100

## 2017-05-15 MED ORDER — DIPHENHYDRAMINE HCL 25 MG PO CAPS: 25.0000 mg | ORAL_CAPSULE | Freq: Four times a day (QID) | ORAL | Status: DC | PRN

## 2017-05-15 MED ORDER — LIDOCAINE HCL (PF) 1 % IJ SOLN
INTRAMUSCULAR | Status: DC | PRN
  Administered 2017-05-15: 2 mL
  Administered 2017-05-15: 5 mL
  Administered 2017-05-15: 3 mL

## 2017-05-15 MED ORDER — FENTANYL CITRATE (PF) 100 MCG/2ML IJ SOLN
INTRAMUSCULAR | Status: AC
  Filled 2017-05-15: qty 2

## 2017-05-15 MED ORDER — ETONOGESTREL 68 MG ~~LOC~~ IMPL
68.0000 mg | DRUG_IMPLANT | Freq: Once | SUBCUTANEOUS | Status: DC
  Filled 2017-05-15: qty 1

## 2017-05-15 MED ORDER — PHENYLEPHRINE 40 MCG/ML (10ML) SYRINGE FOR IV PUSH (FOR BLOOD PRESSURE SUPPORT)
80.0000 ug | PREFILLED_SYRINGE | INTRAVENOUS | Status: DC | PRN
  Filled 2017-05-15: qty 10

## 2017-05-15 MED ORDER — OXYTOCIN BOLUS FROM INFUSION
500.0000 mL | Freq: Once | INTRAVENOUS | Status: AC
  Administered 2017-05-15: 500 mL via INTRAVENOUS

## 2017-05-15 MED ORDER — LIDOCAINE HCL (PF) 1 % IJ SOLN
30.0000 mL | INTRAMUSCULAR | Status: DC | PRN
  Filled 2017-05-15: qty 30

## 2017-05-15 MED ORDER — SENNOSIDES-DOCUSATE SODIUM 8.6-50 MG PO TABS
2.0000 | ORAL_TABLET | ORAL | Status: DC
  Administered 2017-05-15 – 2017-05-16 (×2): 2 via ORAL
  Filled 2017-05-15 (×2): qty 2

## 2017-05-15 MED ORDER — AMPICILLIN SODIUM 2 G IJ SOLR
2.0000 g | Freq: Once | INTRAMUSCULAR | Status: AC
  Administered 2017-05-15: 2 g via INTRAVENOUS
  Filled 2017-05-15: qty 2000

## 2017-05-15 MED ORDER — PRENATAL MULTIVITAMIN CH
1.0000 | ORAL_TABLET | Freq: Every day | ORAL | Status: DC
  Administered 2017-05-15 – 2017-05-16 (×2): 1 via ORAL
  Filled 2017-05-15 (×2): qty 1

## 2017-05-15 MED ORDER — LACTATED RINGERS IV SOLN
500.0000 mL | Freq: Once | INTRAVENOUS | Status: AC
  Administered 2017-05-15: 500 mL via INTRAVENOUS

## 2017-05-15 MED ORDER — ZOLPIDEM TARTRATE 5 MG PO TABS: 5.0000 mg | ORAL_TABLET | Freq: Every evening | ORAL | Status: DC | PRN

## 2017-05-15 MED ORDER — OXYTOCIN 40 UNITS IN LACTATED RINGERS INFUSION - SIMPLE MED
2.5000 [IU]/h | INTRAVENOUS | Status: DC
  Administered 2017-05-15: 2.5 [IU]/h via INTRAVENOUS
  Filled 2017-05-15: qty 1000

## 2017-05-15 MED ORDER — ONDANSETRON HCL 4 MG/2ML IJ SOLN: 4.0000 mg | INTRAMUSCULAR | Status: DC | PRN

## 2017-05-15 MED ORDER — FENTANYL CITRATE (PF) 100 MCG/2ML IJ SOLN
50.0000 ug | INTRAMUSCULAR | Status: DC | PRN
  Administered 2017-05-15: 100 ug via INTRAVENOUS

## 2017-05-15 MED ORDER — FLEET ENEMA 7-19 GM/118ML RE ENEM: 1.0000 | ENEMA | RECTAL | Status: DC | PRN

## 2017-05-15 MED ORDER — LACTATED RINGERS IV SOLN: 500.0000 mL | Freq: Once | INTRAVENOUS | Status: DC

## 2017-05-15 NOTE — Anesthesia Postprocedure Evaluation (Signed)
Anesthesia Post Note  Patient: Jolene Schimkeamaira J Gutknecht  Procedure(s) Performed: * No procedures listed *     Patient location during evaluation: Mother Baby Anesthesia Type: Spinal Level of consciousness: awake Pain management: pain level controlled Vital Signs Assessment: post-procedure vital signs reviewed and stable Respiratory status: spontaneous breathing Cardiovascular status: stable Postop Assessment: no headache, no backache, spinal receding, patient able to bend at knees, no signs of nausea or vomiting and adequate PO intake Anesthetic complications: no    Last Vitals:  Vitals:   05/15/17 0917 05/15/17 1015  BP: 110/63 (!) 118/56  Pulse: 82 77  Resp: 16 18  Temp: 36.4 C 36.7 C  SpO2: 100%     Last Pain:  Vitals:   05/15/17 1530  TempSrc:   PainSc: 0-No pain   Pain Goal: Patients Stated Pain Goal: 6 (05/15/17 0515)               Fanny DanceMULLINS,Eilidh Marcano

## 2017-05-15 NOTE — Anesthesia Procedure Notes (Signed)
Epidural Patient location during procedure: OB Start time: 05/15/2017 5:25 AM End time: 05/15/2017 5:30 AM  Staffing Anesthesiologist: Marcene DuosFITZGERALD, Juvencio Verdi Performed: anesthesiologist   Preanesthetic Checklist Completed: patient identified, site marked, surgical consent, pre-op evaluation, timeout performed, IV checked, risks and benefits discussed and monitors and equipment checked  Epidural Patient position: sitting Prep: site prepped and draped and DuraPrep Patient monitoring: continuous pulse ox and blood pressure Approach: midline Location: L4-L5 Injection technique: LOR air  Needle:  Needle type: Tuohy  Needle gauge: 17 G Needle length: 9 cm and 9 Needle insertion depth: 6 cm Catheter type: closed end flexible Catheter size: 19 Gauge Catheter at skin depth: 12 (11--12cm when laid in lat decub) cm Test dose: negative  Assessment Events: blood not aspirated, injection not painful, no injection resistance, negative IV test and no paresthesia

## 2017-05-15 NOTE — H&P (Signed)
LABOR ADMISSION HISTORY AND PHYSICAL  Tracey [name] is a 21 y.o. female [phone] with IUP at 4750w6d by 1st trimester U/S presenting for SOL. She reports +FMs, No LOF, no VB, no blurry vision, no headaches, no peripheral edema, and no RUQ pain.  She plans on bottle feeding. She requests Depo shot for birth control.  Dating: By 1st trimester U/S --->  Estimated Date of Delivery: 05/16/17  Sono:   @[redacted]w[redacted]d , CWD, normal anatomy, cephalic presentation, 860g, 51% EFW, anterior placenta  Prenatal History/Complications: [facility] 1. Teen pregnancy- good support system and has 2 other children 2. Late and limited OB care- did not begin PNC until 19 weeks and did not receive any care for ~2 months during her second trimester, but has otherwise kept all of her appointments. 3. LV echogenic intracardiac focus seen on 26 week US- US otherwise normal 4. GBS positive- will require PCN when in labor   Past Medical History: Past Medical History:  Diagnosis Date  . BV (bacterial vaginosis) 11/09/2012  . Chlamydia   . Pregnant 2011    Past Surgical History: Past Surgical History:  Procedure Laterality Date  . NO PAST SURGERIES      Obstetrical History: OB History    Gravida Para Term Preterm AB Living   4 2 2   1 2    SAB TAB Ectopic Multiple Live Births   1       2      Social History: Social History   Social History  . Marital status: Single    Spouse name: N/A  . Number of children: N/A  . Years of education: N/A   Social History Main Topics  . Smoking status: Former Smoker    Types: Cigars  . Smokeless tobacco: Never Used     Comment: quit 2015  . Alcohol use No     Comment: none in over a year  . Drug use: No     Comment: former marijuana, ~ a yr ago  . Sexual activity: Yes   Other Topics Concern  . Not on file   Social History Narrative  . No narrative on file    Family History: Family History  Problem Relation Age of Onset  . Hypertension Maternal  Grandmother   . Asthma Neg Hx   . Cancer Neg Hx   . Diabetes Neg Hx   . Heart disease Neg Hx   . Stroke Neg Hx   . Hearing loss Neg Hx     Allergies: No Known Allergies  Prescriptions Prior to Admission  Medication Sig Dispense Refill Last Dose  . Prenatal Vit-Fe Fumarate-FA (PNV PRENATAL PLUS MULTIVITAMIN) 27-1 MG TABS Take 1 tablet by mouth daily.  0 Past Week at Unknown time     Review of Systems   All systems reviewed and negative except as stated in HPI  Blood pressure 118/75, pulse 84, temperature 98.2 F (36.8 C), temperature source Oral, resp. rate 17, last menstrual period 08/09/2016. General appearance: alert, cooperative and no distress Lungs: normal work of breathing Extremities: Homans sign is negative, no sign of DVT Presentation: cephalic Fetal monitoringBaseline: 135 bpm, Variability: Good {> 6 bpm), Accelerations: Reactive and Decelerations: Variable: mild x 1 Uterine activityFrequency: Every 5-6 minutes Dilation: 6.5 Effacement (%): 80 Station: -2   Prenatal labs: ABO, Rh: O/POS/-- (02/09 1434) Antibody: NEG (02/09 1434) Rubella: 2.13 (02/09 1434) RPR: Non Reactive (07/24 0943)  HBsAg: NEGATIVE (02/09 1434)  HIV: NONREACTIVE (02/09 1434)  GBS:  positive  GTT: 124  Prenatal Transfer Tool  Maternal Diabetes: No Genetic Screening: Declined Maternal Ultrasounds/Referrals: Normal Fetal Ultrasounds or other Referrals:  Other:   An echogenic intracardiac focus is again noted in the left  ventricle. Maternal Substance Abuse:  No Significant Maternal Medications:  None Significant Maternal Lab Results: Lab values include: Group B Strep positive  No results found for this or any previous visit (from the past 24 hour(s)).  Patient Active Problem List   Diagnosis Date Noted  . Normal labor 05/15/2017  . Encounter for supervision of other normal pregnancy, third trimester 10/25/2016    Assessment: Tracey [name] is a 21 y.o. [id number] at [redacted]w[redacted]d  here for SOL  #Labor: Expectant management  #Pain: Epidural upon request #FWB: Cat I #ID: GBS pos - start on ampicillin #MOF: bottle #MOC: Depo shot #Circ: yes - [facility]   Obie Dredge Medical Student 05/15/2017, 4:38 AM  CNM attestation:  I have seen and examined this patient; I agree with above documentation in the resident's note.   Tracey [name] is a 21 y.o. [phone] here for SOL  PE: BP 135/88   Pulse (!) 166   Temp 98.2 F (36.8 C) (Oral)   Resp 18   Ht 5\' 2"  (1.575 m)   Wt 76.7 kg (169 lb)   LMP 08/09/2016 (Exact Date)   SpO2 100%   BMI 30.91 kg/m   Resp: normal effort, no distress Abd: gravid  ROS, labs, PMH reviewed  Plan: Admit to [facility] Expectant management Amp for GBS ppx Anticipate SVD Offer inpt placement of Nex PP  Tris Howell CNM 05/15/2017, 6:39 AM

## 2017-05-15 NOTE — Progress Notes (Signed)
UR chart review completed.  

## 2017-05-15 NOTE — Anesthesia Preprocedure Evaluation (Signed)
Anesthesia Evaluation  Patient identified by MRN, date of birth, ID band Patient awake    Reviewed: Allergy & Precautions, NPO status , Patient's Chart, lab work & pertinent test results  Airway Mallampati: II  TM Distance: >3 FB     Dental   Pulmonary former smoker,    Pulmonary exam normal        Cardiovascular negative cardio ROS Normal cardiovascular exam     Neuro/Psych negative neurological ROS     GI/Hepatic negative GI ROS, Neg liver ROS,   Endo/Other  negative endocrine ROS  Renal/GU negative Renal ROS     Musculoskeletal   Abdominal   Peds  Hematology negative hematology ROS (+)   Anesthesia Other Findings   Reproductive/Obstetrics (+) Pregnancy                             Lab Results  Component Value Date   WBC 9.4 05/15/2017   HGB 11.9 (L) 05/15/2017   HCT 35.0 (L) 05/15/2017   MCV 80.8 05/15/2017   PLT 212 05/15/2017    Anesthesia Physical Anesthesia Plan  ASA: II  Anesthesia Plan: Epidural   Post-op Pain Management:    Induction:   PONV Risk Score and Plan: Treatment may vary due to age or medical condition  Airway Management Planned: Natural Airway  Additional Equipment:   Intra-op Plan:   Post-operative Plan:   Informed Consent: I have reviewed the patients History and Physical, chart, labs and discussed the procedure including the risks, benefits and alternatives for the proposed anesthesia with the patient or authorized representative who has indicated his/her understanding and acceptance.     Plan Discussed with: CRNA  Anesthesia Plan Comments:         Anesthesia Quick Evaluation

## 2017-05-15 NOTE — MAU Note (Signed)
Pt c/o contractions that started 1-2 hours ago. States she has not timed them but they are "back to back." Reports good fetal movement. Denies vaginal bleeding. States she noticed her panties were wet when she woke up-unsure if water is broken. Was 3cm yesterday when she was checked.

## 2017-05-15 NOTE — Progress Notes (Signed)
Discussed contraception options with patient.  She currently wants Depo, but she says that she has forgotten to get the shot in the past.  She does not want to get pregnant anytime in the near future.  We discussed the benefit of the Nexplanon and IUDs.  I explained how they are inserted, how long they last, and what their benefits and potential side effects are.  She is interested in the Nexplanon but wants to consult with her cousin who is a Engineer, civil (consulting)nurse before making her decision.  I told her that we can give her the Nexplanon before she leaves the hospital if she chooses this option.  We will check back with her later today about her decision.

## 2017-05-15 NOTE — Procedures (Signed)
PROCEDURE NOTE: Nexplanon Insertion  Patient given informed consent, signed copy in the chart, time out was performed.  Postpartum day 0.   Patient's right arm was prepped and draped in the usual sterile fashion. The ruler used to measure and mark insertion area. Pt was prepped with alcohol swab and then injected with 3 cc of 1% lidocaine with epinephrine used to anesthetize the area.  Pt was prepped with betadine, nexplanon removed from packaging, device confirmed in needle, then inserted full length of needle and withdrawn per manufacturer's instructions. Minimal blood loss. A pressure bandage to minimize bruising. There were no complications and the patient tolerated the procedure well.  Device information was given in handout form. Patient is informed the removal date will be in three years and package insert card filled out and given to her.

## 2017-05-16 ENCOUNTER — Encounter (HOSPITAL_COMMUNITY): Payer: Self-pay

## 2017-05-16 NOTE — Progress Notes (Addendum)
Post Partum Day 1 Subjective: no complaints, up ad lib, voiding, tolerating PO and + flatus  Objective: Blood pressure 104/64, pulse 71, temperature 98.3 F (36.8 C), temperature source Oral, resp. rate 17, height 5\' 2"  (1.575 m), weight 76.7 kg (169 lb), last menstrual period 08/09/2016, SpO2 100 %, unknown if currently breastfeeding.  Physical Exam:  General: alert and cooperative Lochia: appropriate Uterine Fundus: firm DVT Evaluation: No evidence of DVT seen on physical exam.   Recent Labs  05/15/17 0440  HGB 11.9*  HCT 35.0*    Assessment/Plan: Plan for discharge tomorrow  Nexplanon placed 8/30   LOS: 1 day   Tillman Sersngela C Riccio 05/16/2017, 7:20 AM   I have seen and examined this patient and agree with the management plan.

## 2017-05-16 NOTE — Plan of Care (Signed)
Problem: Urinary Elimination: Goal: Ability to reestablish a normal urinary elimination pattern will improve Outcome: Completed/Met Date Met: 05/16/17 Patient is voiding with no issues.

## 2017-05-16 NOTE — Plan of Care (Signed)
Problem: Coping: Goal: Ability to cope will improve Outcome: Completed/Met Date Met: 05/16/17 Patient is calm, cooperative and is able to verbalize feelings. Edinburgh score of 1.

## 2017-05-16 NOTE — Plan of Care (Signed)
Problem: Role Relationship: Goal: Ability to demonstrate positive interaction with newborn will improve Outcome: Completed/Met Date Met: 05/16/17 Patient is bonding well with newborn.    

## 2017-05-17 MED ORDER — IBUPROFEN 600 MG PO TABS: 600.0000 mg | ORAL_TABLET | Freq: Four times a day (QID) | ORAL | 0 refills | Status: DC | PRN

## 2017-05-17 NOTE — Discharge Summary (Signed)
OB Discharge Summary     Patient Name: Tracey [name] DOB: April 27, 1996 MRN: [id number]  Date of admission: 05/15/2017 Delivering MD: Julieanne Cotton T   Date of discharge: 05/17/2017  Admitting diagnosis: 20 WEEKS CTX Intrauterine pregnancy: [redacted]w[redacted]d     Secondary diagnosis:  Active Problems:   Normal labor  Additional problems: adolescent preg; GBS pos     Discharge diagnosis: Term Pregnancy Delivered                                                                                                Post partum procedures:Nexplanon placement  Augmentation: none  Complications: None  Hospital course:  Onset of Labor With Vaginal Delivery     21 y.o. yo [id number] at [redacted]w[redacted]d was admitted in Active Labor on 05/15/2017. Patient had an uncomplicated, precipitous labor course as follows:  Membrane Rupture Time/Date: 6:05 AM ,05/15/2017   Intrapartum Procedures: Episiotomy: None [1]                                         Lacerations:  None [1]  Patient had a delivery of a Viable infant. She got a single dose of Amp within 30 mins of delivery. 05/15/2017  Information for the patient's newborn:  Makynli, Stills [[id number]]  Delivery Method: Vaginal, Spontaneous Delivery (Filed from Delivery Summary)    Pateint had an uncomplicated postpartum course.  She is ambulating, tolerating a regular diet, passing flatus, and urinating well. Patient is discharged home in stable condition on 05/17/17.   Physical exam  Vitals:   05/15/17 2328 05/16/17 0613 05/16/17 1742 05/17/17 0544  BP: (!) 113/53 104/64 113/68 112/71  Pulse: 80 71 80 79  Resp: 17 17 20 20   Temp: 98.1 F (36.7 C) 98.3 F (36.8 C) 98.6 F (37 C) 98.4 F (36.9 C)  TempSrc: Oral Oral  Oral  SpO2: 100% 100%    Weight:      Height:       General: alert and cooperative Lochia: appropriate Uterine Fundus: firm Incision: N/A DVT Evaluation: No evidence of DVT seen on physical exam. Labs: Lab Results  Component Value Date    WBC 9.4 05/15/2017   HGB 11.9 (L) 05/15/2017   HCT 35.0 (L) 05/15/2017   MCV 80.8 05/15/2017   PLT 212 05/15/2017   No flowsheet data found.  Discharge instruction: per After Visit Summary and "Baby and Me Booklet".  After visit meds:  Allergies as of 05/17/2017   No Known Allergies     Medication List    TAKE these medications   ibuprofen 600 MG tablet Commonly known as:  ADVIL,MOTRIN Take 1 tablet (600 mg total) by mouth every 6 (six) hours as needed.   PNV PRENATAL PLUS MULTIVITAMIN 27-1 MG Tabs Take 1 tablet by mouth daily.            Discharge Care Instructions        Start     Ordered   05/17/17 0000  ibuprofen (ADVIL,MOTRIN) 600 MG  tablet  Every 6 hours PRN    Question:  Supervising Provider  Answer:  Tereso NewcomerNYANWU, UGONNA A   05/17/17 [id number]   05/17/17 0000  Discharge patient    Question Answer Comment  Discharge disposition 01-Home or Self Care   Discharge patient date 05/17/2017      05/17/17 0718   05/15/17 0000  OB RESULT CONSOLE Group B Strep    Comments:  This external order was created through the Results Console.   05/15/17 0456   05/15/17 0000  OB RESULTS CONSOLE GC/Chlamydia    Comments:  This external order was created through the Results Console.   05/15/17 0456      Diet: routine diet  Activity: Advance as tolerated. Pelvic rest for 6 weeks.   Outpatient follow up:4 weeks Follow up Appt:No future appointments. Follow up Visit:No Follow-up on file.  Postpartum contraception: Nexplanon- already placed  Newborn Data: Live born female  Birth Weight: 6 lb 8.1 oz (2951 g) APGAR: 9, 9  Baby Feeding: Bottle Disposition:home with mother   05/17/2017 Cam HaiSHAW, Helma Argyle, CNM 7:21 AM

## 2017-05-17 NOTE — Discharge Instructions (Signed)

## 2017-05-20 ENCOUNTER — Ambulatory Visit (HOSPITAL_COMMUNITY): Payer: Medicaid Other

## 2017-05-23 ENCOUNTER — Inpatient Hospital Stay (HOSPITAL_COMMUNITY): Admission: RE | Admit: 2017-05-23 | Payer: Medicaid Other | Source: Ambulatory Visit

## 2017-06-02 ENCOUNTER — Encounter: Payer: Self-pay | Admitting: Internal Medicine

## 2017-07-07 ENCOUNTER — Encounter: Payer: Self-pay | Admitting: Internal Medicine

## 2017-07-07 ENCOUNTER — Ambulatory Visit (INDEPENDENT_AMBULATORY_CARE_PROVIDER_SITE_OTHER): Payer: Medicaid Other | Admitting: Internal Medicine

## 2017-07-07 DIAGNOSIS — Z975 Presence of (intrauterine) contraceptive device: Secondary | ICD-10-CM | POA: Diagnosis not present

## 2017-07-07 NOTE — Patient Instructions (Signed)
It was so nice to see you!  Everything looks great today. You can return back to all of your normal activities.  We will see you back in 1 year or earlier if needed.  -Dr. Nancy MarusMayo

## 2017-07-09 DIAGNOSIS — Z113 Encounter for screening for infections with a predominantly sexual mode of transmission: Secondary | ICD-10-CM | POA: Insufficient documentation

## 2017-07-09 DIAGNOSIS — Z975 Presence of (intrauterine) contraceptive device: Secondary | ICD-10-CM | POA: Insufficient documentation

## 2017-07-09 NOTE — Progress Notes (Signed)
   [facility] Phone: [phone]  Subjective:  Tracey Massingamaira is a 21 year old female presenting to clinic for her postpartum visit. She is 7 weeks postpartum following a spontaneous vaginal delivery. -Contraception: Received Nexplanon immediately after delivery. No redness or swelling of her arm. -Bleeding: Having some occasional irregular bleeding. States it is not too bothersome for her. -Bonding with infant: Going well. No issues. -Sexual activity: Did have sexual intercourse for the first time a few days ago. Did not have any issues with this. No dyspareunia. -Mood: Feels that her mood is fine. No sadness, tearfulness, or loss of interest in things she previously enjoyed.  ROS: See HPI for pertinent positives and negatives  Past Medical History- none  Family history reviewed for today's visit. No changes.  Social history- patient is a former smoker. Quit in 2015.  Objective: BP 108/64   Pulse 79   Temp 98.6 F (37 C) (Oral)   Ht 5\' 2"  (1.575 m)   Wt 168 lb (76.2 kg)   LMP 07/01/2017 (Approximate) Comment: irregular bleeding  SpO2 99%   BMI 30.73 kg/m  Gen: NAD, alert, cooperative with exam GI: Soft, non-tender, non-distended, unable to palpate uterus. Msk: Nexplanon in place without any overlying erythema or tenderness.  Assessment/Plan: Postpartum Visit: Patient doing well. No concerns.  - Advised that patient may return to normal activities - Follow-up in 1 year or earlier if needed   Willadean CarolKaty Harol Shabazz, MD PGY-3

## 2017-07-09 NOTE — Assessment & Plan Note (Signed)
Patient doing well. No concerns.  - Advised that patient may return to normal activities - Follow-up in 1 year or earlier if needed

## 2017-12-18 ENCOUNTER — Other Ambulatory Visit: Payer: Self-pay

## 2017-12-18 ENCOUNTER — Encounter: Payer: Self-pay | Admitting: Family Medicine

## 2017-12-18 ENCOUNTER — Ambulatory Visit (INDEPENDENT_AMBULATORY_CARE_PROVIDER_SITE_OTHER): Payer: Medicaid Other | Admitting: Family Medicine

## 2017-12-18 VITALS — BP 100/70 | HR 93 | Temp 98.3°F | Wt 169.0 lb

## 2017-12-18 DIAGNOSIS — N898 Other specified noninflammatory disorders of vagina: Secondary | ICD-10-CM

## 2017-12-18 DIAGNOSIS — B9689 Other specified bacterial agents as the cause of diseases classified elsewhere: Secondary | ICD-10-CM | POA: Diagnosis not present

## 2017-12-18 DIAGNOSIS — N760 Acute vaginitis: Secondary | ICD-10-CM | POA: Diagnosis not present

## 2017-12-18 LAB — POCT WET PREP (WET MOUNT)
Clue Cells Wet Prep Whiff POC: POSITIVE
Trichomonas Wet Prep HPF POC: ABSENT

## 2017-12-18 MED ORDER — METRONIDAZOLE 500 MG PO TABS: 500.0000 mg | ORAL_TABLET | Freq: Two times a day (BID) | ORAL | 0 refills | Status: AC

## 2017-12-18 NOTE — Progress Notes (Signed)
   Subjective:    Patient ID: Tracey [name] is a 22 y.o. female presenting with vaginal odor  on 12/18/2017  HPI: Vaginal odor x several weeks. Having vaginal discharge. Manson PasseyBrown.  Denies irritation or itching.no new sex partner. Denies F/C/N/V. Has Nexplanon and having some irregular spotting. Usually taking baths. Neg GC/Chlam 6 months ago.  Review of Systems  Constitutional: Negative for chills and fever.  Respiratory: Negative for shortness of breath.   Cardiovascular: Negative for chest pain.  Gastrointestinal: Negative for abdominal pain, nausea and vomiting.  Genitourinary: Negative for dysuria.  Skin: Negative for rash.      Objective:    BP 100/70   Pulse 93   Temp 98.3 F (36.8 C) (Oral)   Wt 169 lb (76.7 kg)   LMP 12/18/2017   SpO2 97%   BMI 30.91 kg/m  Physical Exam  Constitutional: She is oriented to person, place, and time. She appears well-developed and well-nourished. No distress.  HENT:  Head: Normocephalic and atraumatic.  Eyes: No scleral icterus.  Neck: Neck supple.  Cardiovascular: Normal rate.  Pulmonary/Chest: Effort normal.  Abdominal: Soft.  Neurological: She is alert and oriented to person, place, and time.  Skin: Skin is warm and dry.  Psychiatric: She has a normal mood and affect.   Source Wet Prep POC VAG   WBC, Wet Prep HPF POC 1-5   Bacteria Wet Prep HPF POC ModerateAbnormal    Clue Cells Wet Prep HPF POC ModerateAbnormal    Clue Cells Wet Prep Whiff POC Positive Whiff   Yeast Wet Prep HPF POC None   KOH Wet Prep POC None   Trichomonas Wet Prep HPF POC Absent       Assessment & Plan:  Vaginal odor - Plan: POCT Wet Prep [employer]([place]et [place]), CANCELED: Cervicovaginal ancillary only  Bacterial vaginosis - Likely has BV--culprit is baths--use Flagyl. EtOH precautions. - Plan: metroNIDAZOLE (FLAGYL) 500 MG tablet   Total face-to-face time with patient: 10 minutes. Over 50% of encounter was spent on counseling and coordination of care. Return  in about 6 months (around 06/19/2018) for a CPE.  Reva Boresanya S Robin Petrakis 12/18/2017 2:39 PM

## 2017-12-18 NOTE — Patient Instructions (Signed)

## 2018-03-03 ENCOUNTER — Encounter: Payer: Self-pay | Admitting: Internal Medicine

## 2018-03-03 ENCOUNTER — Ambulatory Visit (INDEPENDENT_AMBULATORY_CARE_PROVIDER_SITE_OTHER): Payer: Medicaid Other | Admitting: Internal Medicine

## 2018-03-03 DIAGNOSIS — J302 Other seasonal allergic rhinitis: Secondary | ICD-10-CM | POA: Diagnosis not present

## 2018-03-03 MED ORDER — FLUTICASONE PROPIONATE 50 MCG/ACT NA SUSP: 1.0000 | Freq: Every day | NASAL | 5 refills | Status: AC

## 2018-03-03 MED ORDER — OLOPATADINE HCL 0.2 % OP SOLN: 1.0000 [drp] | Freq: Every day | OPHTHALMIC | 5 refills | Status: AC

## 2018-03-03 MED ORDER — CETIRIZINE HCL 10 MG PO TABS: 10.0000 mg | ORAL_TABLET | Freq: Every day | ORAL | 5 refills | Status: DC

## 2018-03-03 NOTE — Assessment & Plan Note (Signed)
Primarily nasal congestion and watery eyes. Less likely infectious etiology as afebrile, no sick contacts, no cough. No abnormalities on physical exam. Will treat symptomatically with Flonase and Pataday eye drops. Also begin daily Zyrtec. F/u if no improvement.

## 2018-03-03 NOTE — Progress Notes (Signed)
   Subjective:   Patient: Tracey [name]       Birthdate: 10/02/1995       MRN: [id number]      HPI  Tracey [name] is a 22 y.o. female presenting for same day appt for allergies.   Allergies Has happened before. Currently with watery, burning eyes, as well as nasal congestion. Denies fever, cough. No sick contacts. Has not taken any medication. Has not been outside more than usual recently. Sx worsened yesterday.   Smoking status reviewed. Patient is former smoker.   Review of Systems See HPI.     Objective:  Physical Exam  Constitutional: She is oriented to person, place, and time. She appears well-developed and well-nourished. No distress.  HENT:  Head: Normocephalic and atraumatic.  Nose: Nose normal.  Mouth/Throat: Oropharynx is clear and moist. No oropharyngeal exudate.  Pulmonary/Chest: Effort normal. No respiratory distress.  Neurological: She is alert and oriented to person, place, and time.  Psychiatric: She has a normal mood and affect. Her behavior is normal.      Assessment & Plan:  Seasonal allergies Primarily nasal congestion and watery eyes. Less likely infectious etiology as afebrile, no sick contacts, no cough. No abnormalities on physical exam. Will treat symptomatically with Flonase and Pataday eye drops. Also begin daily Zyrtec. F/u if no improvement.    Tarri AbernethyAbigail J Colman Birdwell, MD, MPH PGY-3 [facility] Pager [phone]

## 2018-03-03 NOTE — Patient Instructions (Addendum)
It was nice meeting you today Tracey [name]!  For your allergies, please begin taking one Zyrtec daily to help with allergies. For nasal congestion, use the Flonase nasal spray (one spray into each nostril) daily. For eye watering and burning, please use the Pataday eye drops daily.   If your symptoms do not improve, please let us know.   If you have any questions or concerns, please feel free to call the clinic.   Be well,  Dr. Natale MilchLancaster

## 2018-03-23 ENCOUNTER — Ambulatory Visit (INDEPENDENT_AMBULATORY_CARE_PROVIDER_SITE_OTHER): Payer: Medicaid Other

## 2018-03-23 DIAGNOSIS — Z111 Encounter for screening for respiratory tuberculosis: Secondary | ICD-10-CM | POA: Diagnosis not present

## 2018-03-23 NOTE — Progress Notes (Signed)
   Tuberculin skin test applied to right ventral forearm. Appointment made for PPD reading on 03/25/18 at 10 am. Reminder card given.  Ples SpecterAlisa Brake, RN [facility] RN)

## 2018-03-25 ENCOUNTER — Ambulatory Visit: Payer: Medicaid Other | Admitting: *Deleted

## 2018-03-25 ENCOUNTER — Ambulatory Visit: Payer: Medicaid Other

## 2018-03-25 DIAGNOSIS — Z111 Encounter for screening for respiratory tuberculosis: Secondary | ICD-10-CM

## 2018-03-25 LAB — TB SKIN TEST
INDURATION: 0 mm
TB SKIN TEST: NEGATIVE

## 2018-03-25 NOTE — Progress Notes (Signed)
PPD Reading Note  Result: 0 mm induration. Interpretation: Negative   Letter created and printed for pt. Tracey [name], Maryjo RochesterJessica Dawn, CMA

## 2018-03-26 ENCOUNTER — Encounter: Payer: Self-pay | Admitting: Family Medicine

## 2018-07-23 ENCOUNTER — Ambulatory Visit (INDEPENDENT_AMBULATORY_CARE_PROVIDER_SITE_OTHER): Payer: Self-pay | Admitting: *Deleted

## 2018-07-23 DIAGNOSIS — Z23 Encounter for immunization: Secondary | ICD-10-CM

## 2018-09-11 IMAGING — US US MFM OB FOLLOW-UP
2 series · 14 of 28 positions shown · non-contrast
Comparison: none

[Series 1: us mfm ob follow-up · 3 of 12 slices shown (1 of 2)]
[im 3/12]
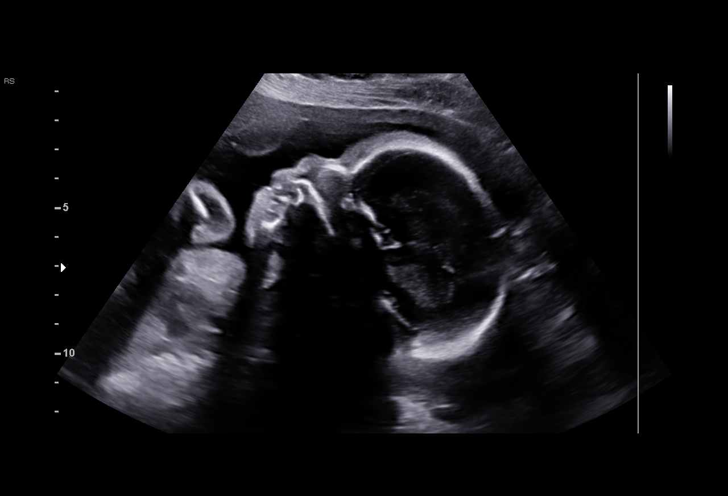
[im 7/12]
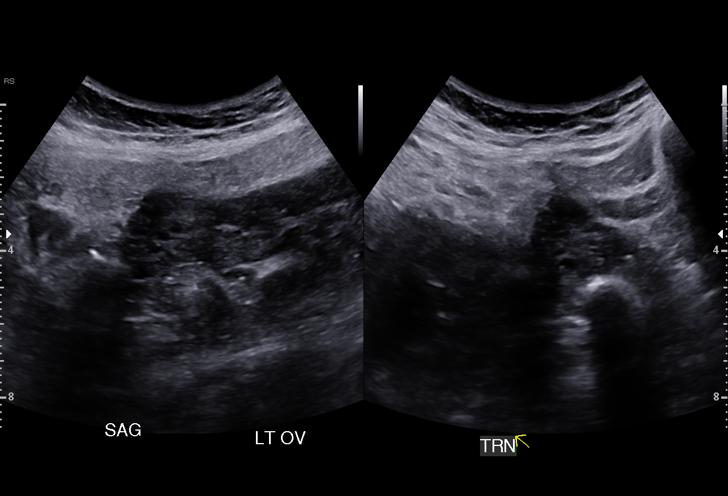
[im 12/12]
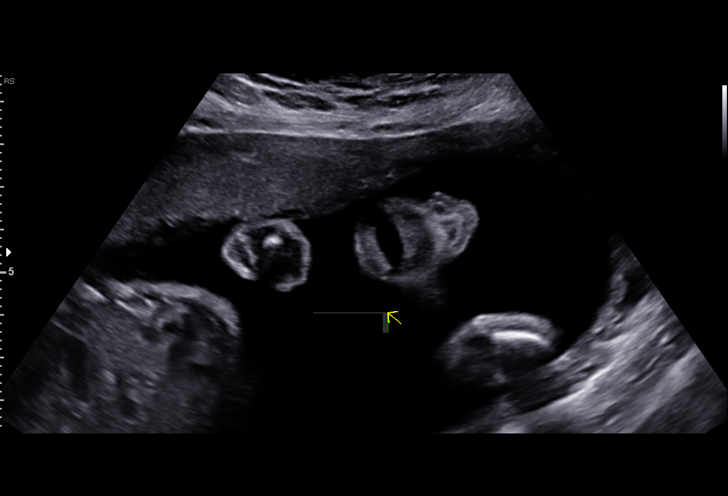

[Series 2: us mfm ob follow-up · 42 acquisitions, 11 frames shown (2 of 2)]
[im 2/42]
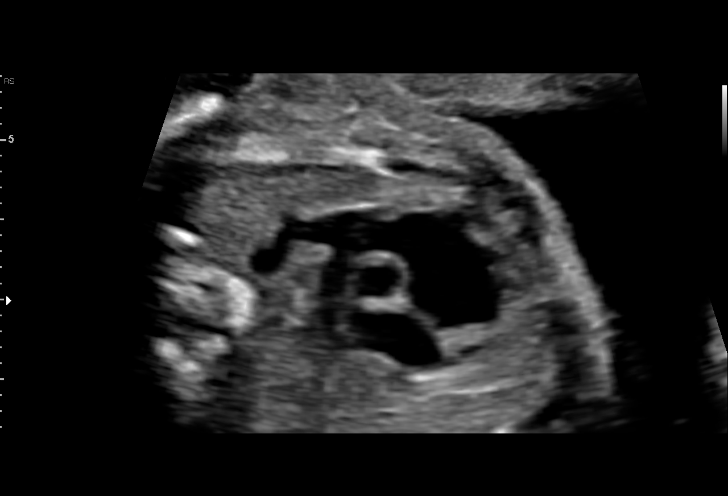
[im 6/42]
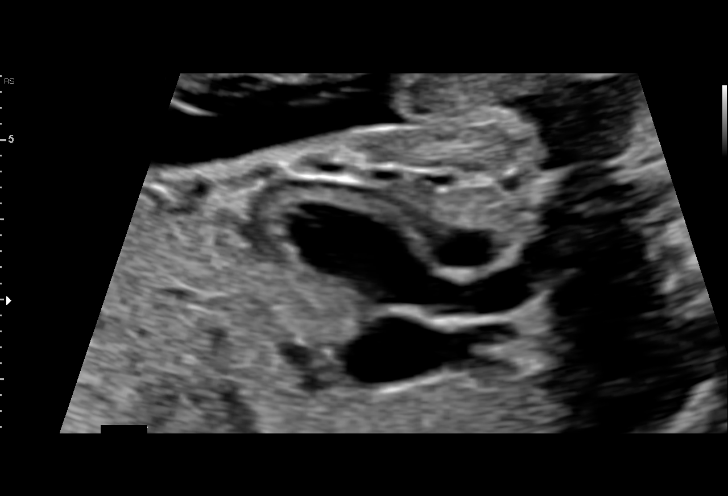
[im 10/42]
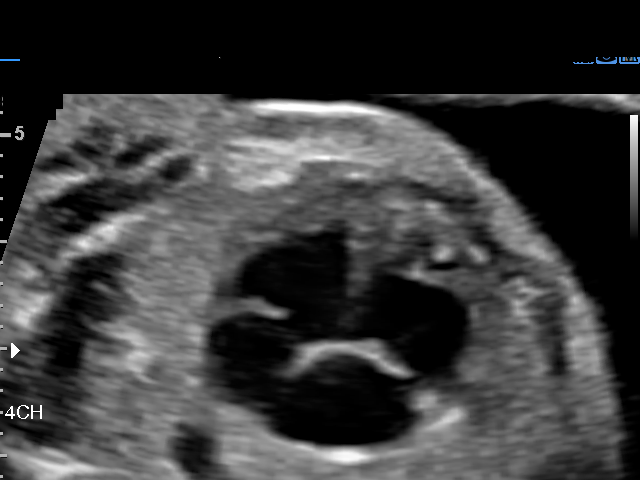
[im 14/42]
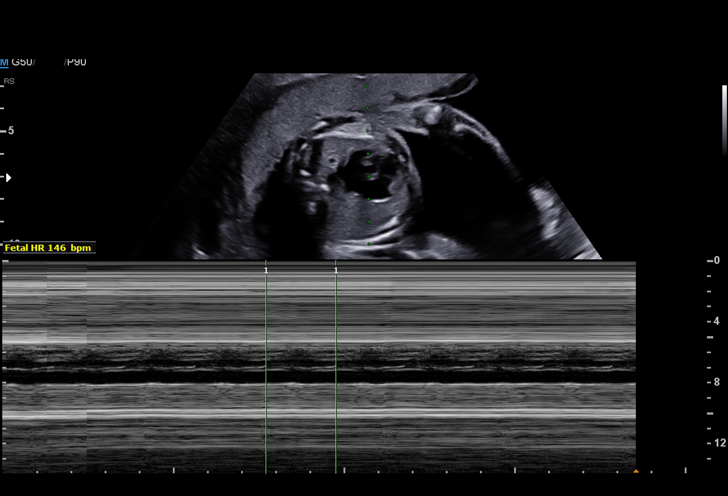
[im 18/42]
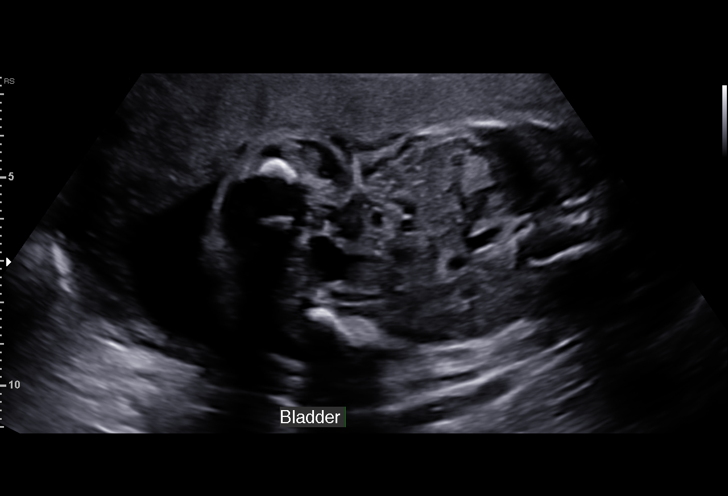
[im 22/42]
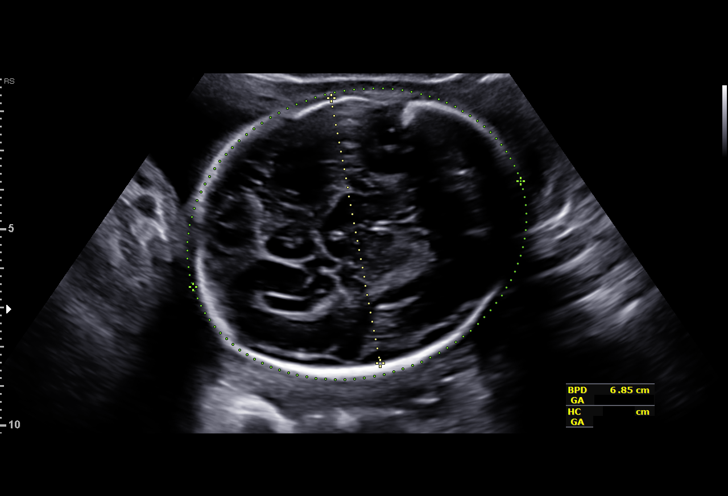
[im 26/42]
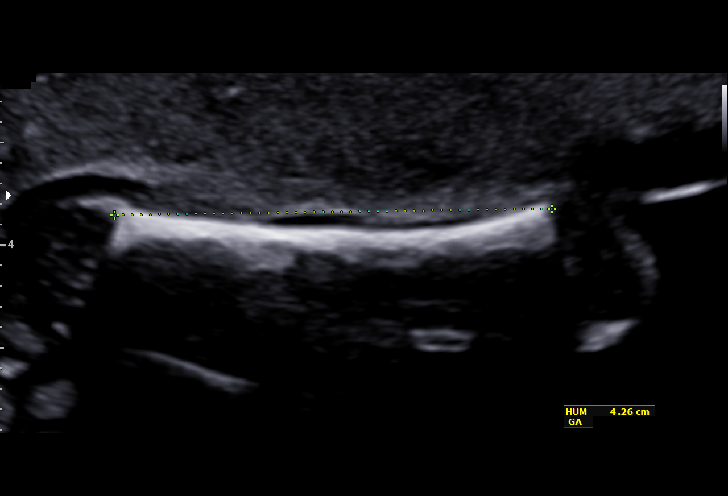
[im 30/42]
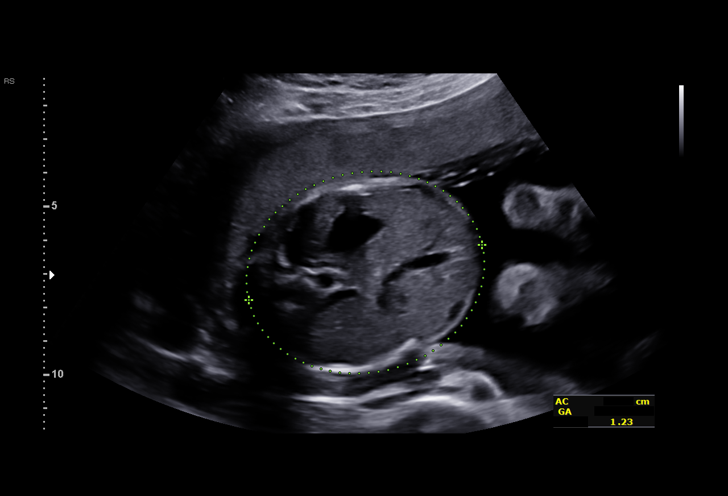
[im 34/42]
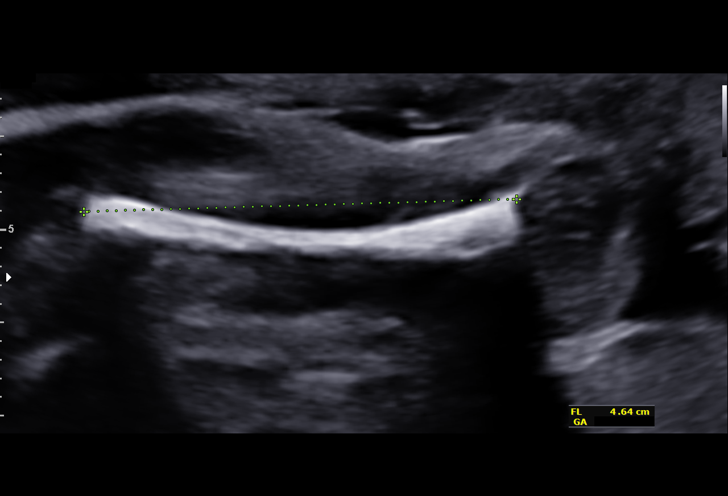
[im 38/42]
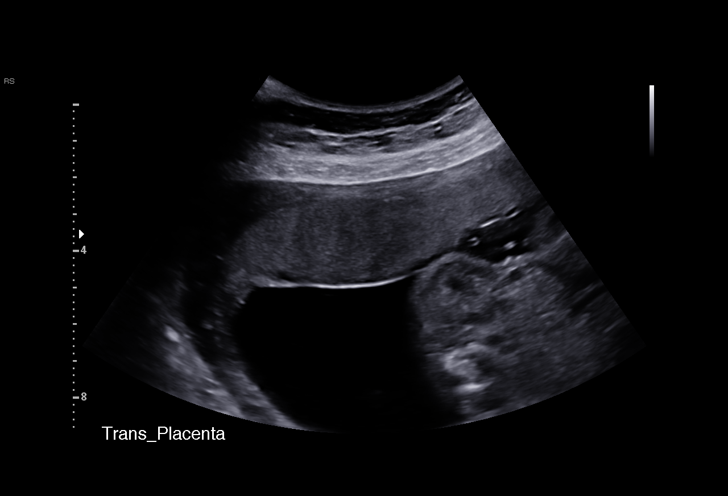
[im 42/42]
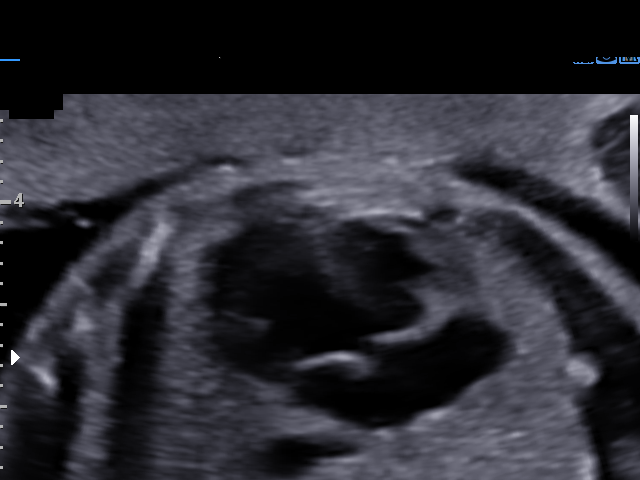

[14 of 28 positions shown; findings below may reference images not displayed]

Sreet

1  DEKTI                 [id number]      [id number]     [id number]
EZRY
Indications

26 weeks gestation of pregnancy
Teen pregnancy
Encounter for other antenatal screening
follow-up
Antenatal follow-up for nonvisualized fetal
anatomy
OB History

Blood Type:            Height:  5'4"   Weight (lb):  147       BMI:
Gravidity:    4         Term:   2        Prem:   0        SAB:   1
TOP:          0       Ectopic:  0        Living: 2
Fetal Evaluation

Num Of Fetuses:     1
Fetal Heart         146
Rate(bpm):
Cardiac Activity:   Observed
Presentation:       Cephalic
Placenta:           Anterior, above cervical os
P. Cord Insertion:  Previously Visualized

Amniotic Fluid
AFI FV:      Subjectively within normal limits

Largest Pocket(cm)
6.76
Biometry
BPD:      68.5  mm     G. Age:  27w 4d         87  %    CI:        73.63   %    70 - 86
FL/HC:      18.8   %    18.6 -
HC:      253.6  mm     G. Age:  27w 4d         77  %    HC/AC:      1.23        1.04 -
AC:      206.9  mm     G. Age:  25w 2d         21  %    FL/BPD:     69.6   %    71 - 87
FL:       47.7  mm     G. Age:  26w 0d         35  %    FL/AC:      23.1   %    20 - 24
HUM:      42.4  mm     G. Age:  25w 3d         32  %

Est. FW:     860  gm    1 lb 14 oz      51  %
Gestational Age

LMP:           27w 4d        Date:  07/29/16                 EDD:   05/05/17
U/S Today:     26w 4d                                        EDD:   05/12/17
Best:          26w 0d     Det. By:  Early Ultrasound         EDD:   05/16/17
(09/19/16)
Anatomy

Cranium:               Appears normal         Aortic Arch:            Previously seen
Cavum:                 Previously seen        Ductal Arch:            Previously seen
Ventricles:            Appears normal         Diaphragm:              Previously seen
Choroid Plexus:        Previously seen        Stomach:                Appears normal, left
sided
Cerebellum:            Previously seen        Abdomen:                Appears normal
Posterior Fossa:       Previously seen        Abdominal Wall:         Previously seen
Nuchal Fold:           Not applicable (>20    Cord Vessels:           Previously seen
wks GA)
Face:                  Profile appears        Kidneys:                Appear normal
normal, orbits prev
vi
Lips:                  Previously seen        Bladder:                Appears normal
Thoracic:              Appears normal         Spine:                  Previously seen
Heart:                 Appears normal; EIF    Upper Extremities:      Previously seen
RVOT:                  Appears normal         Lower Extremities:      Previously seen
LVOT:                  Appears normal

Other:  Male gender. Heels and 5th digit previously visualized.
Cervix Uterus Adnexa

Cervix
Length:           3.13  cm.
Normal appearance by transabdominal scan.

Uterus
No abnormality visualized.

Left Ovary
Within normal limits.

Right Ovary
Within normal limits.

Adnexa:       No abnormality visualized. No adnexal mass
visualized.
Impression

Single IUP at 26w 0d
Normal interval anatomy
An echogenic intracardiac focus is again noted in the left
ventricle.
Fetal growth is appropriate (51st %tile)
Anterior placenta without previa
Normal amniotic fluid volume
Recommendations

Follow-up ultrasounds as clinically indicated.

## 2018-10-29 ENCOUNTER — Ambulatory Visit: Payer: Self-pay | Admitting: Family Medicine

## 2019-04-19 ENCOUNTER — Ambulatory Visit: Payer: Self-pay

## 2019-08-11 ENCOUNTER — Ambulatory Visit (INDEPENDENT_AMBULATORY_CARE_PROVIDER_SITE_OTHER): Payer: Self-pay | Admitting: Family Medicine

## 2019-08-11 ENCOUNTER — Other Ambulatory Visit: Payer: Self-pay

## 2019-08-11 ENCOUNTER — Encounter: Payer: Self-pay | Admitting: Family Medicine

## 2019-08-11 ENCOUNTER — Other Ambulatory Visit (HOSPITAL_COMMUNITY)
Admission: RE | Admit: 2019-08-11 | Discharge: 2019-08-11 | Disposition: A | Discharge status: Home / Self Care | Payer: Self-pay | Source: Ambulatory Visit | Attending: Family Medicine | Admitting: Family Medicine

## 2019-08-11 VITALS — BP 110/80 | HR 82 | Wt 174.4 lb

## 2019-08-11 DIAGNOSIS — N898 Other specified noninflammatory disorders of vagina: Secondary | ICD-10-CM | POA: Insufficient documentation

## 2019-08-11 LAB — POCT WET PREP (WET MOUNT)
Clue Cells Wet Prep Whiff POC: POSITIVE
Trichomonas Wet Prep HPF POC: ABSENT

## 2019-08-11 MED ORDER — METRONIDAZOLE 500 MG PO TABS: 500.0000 mg | ORAL_TABLET | Freq: Two times a day (BID) | ORAL | 0 refills | Status: DC

## 2019-08-11 MED ORDER — METRONIDAZOLE 500 MG PO TABS: 500.0000 mg | ORAL_TABLET | Freq: Two times a day (BID) | ORAL | 0 refills | Status: AC

## 2019-08-11 NOTE — Assessment & Plan Note (Addendum)
Patient presenting with symptoms of vaginal discharge.  Given that she had 3 children with her who are exhibiting URI-like symptoms the decision was made to do history over the telephone and have self swabs.  We have several high risk patients here in clinic so we try to limit exposure of staff as well as other patients.  Self swab were collected and sent to lab.  We will plan to call patient back with results.  If positive for yeast or BV will give medications as indicated.  If positive for trichomonas will take metronidazole as well as advise all sexual partners will be treated.  Gonorrhea chlamydia swab will also be obtained, will need treatment if positive.  Can consider at home treatment versus coming into nurse clinic given concern of sick children at home.  After patient had left, wet prep consistent with BV. RX for metronidazole sent and message sent via mychart explaining diagnosis and treatment. Advised to avoid alcohol. Follow up in 1-2 weeks if no improvement.

## 2019-08-11 NOTE — Patient Instructions (Signed)
It was a pleasure seeing you today.   Today we discussed your vaginal discharge   For your discharge: we will call/send mychart message with the results of your tests. If they are positive I will send in treatment. If no improvement in 1-2 weeks please follow up.   Please follow up in 1-2 weeks or sooner if symptoms persist or worsen. Please call the clinic immediately if you have any concerns.   Our clinic's number is [phone]. Please call with questions or concerns.   Please go to the emergency room if you have significant pain or bleeding  Thank you,  Caroline More, DO

## 2019-08-11 NOTE — Progress Notes (Signed)
   Subjective:    Patient ID: Tracey [name], female    DOB: Jul 19, 1996, 23 y.o.   MRN: [id number]  I connected with  Kahlyn Shippey Gorniak on 08/11/19 by a telephone enabled telemedicine application and verified that I am speaking with the correct person using two identifiers. Patient was here in clinic but had three children exhibiting URI like symptoms. Given increase in COVID numbers and high risk patients in our clinic the decision was made to have patient do history over the telephone and do self swab tests to avoid exposure of possible COVID 19.    I discussed the limitations of evaluation and management by telemedicine. The patient expressed understanding and agreed to proceed.  CC: vaginal discharge  HPI: VAGINAL DISCHARGE Onset: 2 weeks ago    Description: clear/grey, sometimes brown   Odor: "garbage" "fish"    Itching: no   Symptoms Dysuria: no  Bleeding: no  Pelvic pain: no  Back pain: no  Fever: no  Genital sores: no  Rash: no  Dyspareunia: no  GI Sxs: no  Prior treatment: no   Red Flags: Missed period: no  Pregnancy: no  Recent antibiotics: no  Sexual activity: yes, same partner   Possible STD exposure: no  IUD: no  Diabetes: no    Objective:  BP 110/80   Pulse 82   Wt 174 lb 6.4 oz (79.1 kg)   SpO2 99%   BMI 31.90 kg/m  Vitals and nursing note reviewed  General: NAD Resp: speaking full sentences  Female genitalia: not done   Assessment & Plan:    Vaginal discharge Patient presenting with symptoms of vaginal discharge.  Given that she had 3 children with her who are exhibiting URI-like symptoms the decision was made to do history over the telephone and have self swabs.  We have several high risk patients here in clinic so we try to limit exposure of staff as well as other patients.  Self swab were collected and sent to lab.  We will plan to call patient back with results.  If positive for yeast or BV will give medications as indicated.  If  positive for trichomonas will take metronidazole as well as advise all sexual partners will be treated.  Gonorrhea chlamydia swab will also be obtained, will need treatment if positive.  Can consider at home treatment versus coming into nurse clinic given concern of sick children at home.  After patient had left, wet prep consistent with BV. RX for metronidazole sent and message sent via mychart explaining diagnosis and treatment. Advised to avoid alcohol. Follow up in 1-2 weeks if no improvement.     Return in about 2 weeks (around 08/25/2019), or if symptoms worsen or fail to improve.   Caroline More, DO, PGY-3

## 2019-08-13 LAB — CERVICOVAGINAL ANCILLARY ONLY
Chlamydia: NEGATIVE
Comment: NEGATIVE
Comment: NORMAL
Neisseria Gonorrhea: NEGATIVE

## 2019-11-23 ENCOUNTER — Other Ambulatory Visit: Payer: Self-pay

## 2019-11-23 ENCOUNTER — Telehealth (INDEPENDENT_AMBULATORY_CARE_PROVIDER_SITE_OTHER): Payer: Self-pay | Admitting: Family Medicine

## 2019-11-23 DIAGNOSIS — J302 Other seasonal allergic rhinitis: Secondary | ICD-10-CM

## 2019-11-23 DIAGNOSIS — Z124 Encounter for screening for malignant neoplasm of cervix: Secondary | ICD-10-CM | POA: Insufficient documentation

## 2019-11-23 MED ORDER — CETIRIZINE HCL 10 MG PO TABS: 10.0000 mg | ORAL_TABLET | Freq: Every day | ORAL | 5 refills | Status: AC

## 2019-11-23 NOTE — Telephone Encounter (Signed)
Patient calls nurse line requesting refill of zytec. Last office visit that allergies were discussed was on 03/03/2018. Attempted to call patient to discuss potential for virtual or office visit appointment to discuss medication management, as it has been over a year since she has been evaluated for this concern.   LVM for patient to return phone call to discuss above.  To PCP  Veronda Prude, RN

## 2019-11-23 NOTE — Telephone Encounter (Signed)
Patient returning RN's call, scheduled patient for a virtual visit this afternoon on ATC to discuss her allergies and getting a refill on her zyrtec.

## 2019-11-23 NOTE — Assessment & Plan Note (Signed)
Accepts schedule for PAP, asks for female physician, no complaints

## 2019-11-23 NOTE — Assessment & Plan Note (Signed)
No distress, just annoying runny nose and OTC not sufficient  Refilled zyrtek

## 2019-11-23 NOTE — Progress Notes (Signed)
 [facility] Telemedicine Visit  Patient consented to have virtual visit. Method of visit: Video was attempted, but technology challenges prevented patient from using video, so visit was conducted via telephone.  Encounter participants: Patient: MONTEZ STRYKER - located at home Provider: Marthenia Rolling - located at [facility] clinic  Chief Complaint: Refill of medications  HPI:  No respiratory problems, just running nose and had ran out of zyrtek, tried OTC claritin and said it didn't work.  Does consent to PAP scheduling with female physician, did not know what PAP was so we discussed that briefly and she had no f/u questions.  ROS: per HPI  Pertinent PMHx: seasonal allergies  Exam:  Video failed so we talked on phone,calm discussion in full sentences, no distress  Assessment/Plan:  Cervical cancer screening Accepts schedule for PAP, asks for female physician, no complaints  Seasonal allergies No distress, just annoying runny nose and OTC not sufficient  Refilled zyrtek    Time spent during visit with patient: 7 minutes

## 2019-11-26 ENCOUNTER — Ambulatory Visit: Payer: Self-pay | Admitting: Family Medicine

## 2019-12-24 ENCOUNTER — Other Ambulatory Visit (HOSPITAL_COMMUNITY)
Admission: RE | Admit: 2019-12-24 | Discharge: 2019-12-24 | Disposition: A | Discharge status: Home / Self Care | Payer: Self-pay | Source: Ambulatory Visit | Attending: Family Medicine | Admitting: Family Medicine

## 2019-12-24 ENCOUNTER — Encounter: Payer: Self-pay | Admitting: Family Medicine

## 2019-12-24 ENCOUNTER — Other Ambulatory Visit: Payer: Self-pay

## 2019-12-24 ENCOUNTER — Ambulatory Visit (INDEPENDENT_AMBULATORY_CARE_PROVIDER_SITE_OTHER): Payer: Self-pay | Admitting: Family Medicine

## 2019-12-24 VITALS — BP 110/76 | HR 99 | Ht 63.39 in | Wt 182.4 lb

## 2019-12-24 DIAGNOSIS — N898 Other specified noninflammatory disorders of vagina: Secondary | ICD-10-CM

## 2019-12-24 DIAGNOSIS — Z124 Encounter for screening for malignant neoplasm of cervix: Secondary | ICD-10-CM | POA: Insufficient documentation

## 2019-12-24 DIAGNOSIS — B9689 Other specified bacterial agents as the cause of diseases classified elsewhere: Secondary | ICD-10-CM

## 2019-12-24 DIAGNOSIS — N760 Acute vaginitis: Secondary | ICD-10-CM

## 2019-12-24 LAB — POCT WET PREP (WET MOUNT)
Clue Cells Wet Prep Whiff POC: POSITIVE
Trichomonas Wet Prep HPF POC: ABSENT

## 2019-12-24 MED ORDER — METRONIDAZOLE 0.75 % EX GEL: 1.0000 "application " | Freq: Every day | CUTANEOUS | 0 refills | Status: DC

## 2019-12-24 NOTE — Assessment & Plan Note (Addendum)
-  MetroGel daily for 5 days -Follow-up GC/chlamydia on Pap smear -Follow-up HIV, RPR at subsequent visit.  Orders placed today but she left before blood work could be done.

## 2019-12-24 NOTE — Progress Notes (Signed)
    SUBJECTIVE:   CHIEF COMPLAINT / HPI:   Vaginal malodor Ms. Tracey [name] noted that she has been having some vaginal malodor for the past day or 2.  She notes that she only recently finished her period and noticed some malodor following that.  She denies fever, abdominal pain, nausea, vomiting, pelvic pain, dysuria.  She is sexually active.  She has never had a Pap smear done before.  PERTINENT  PMH / PSH: Currently using Nexplanon for birth control  OBJECTIVE:   BP 110/76   Pulse 99   Ht 5' 3.39" (1.61 m)   Wt 182 lb 6.4 oz (82.7 kg)   LMP 12/20/2019 (Approximate)   SpO2 99%   BMI 31.92 kg/m    General: Seated comfortably on the exam table.  No acute distress. Pelvic: Normal external female genitalia.  Healthy vaginal mucosa.  Mild leukorrhea.  Os visualized with mild bloody discharge.  Samples taken of leukorrhea.  Samples taken for Pap smear.  Bimanual exam normal.  No cervical motion tenderness.  No adnexal masses.  ASSESSMENT/PLAN:   Bacterial vaginosis -MetroGel daily for 5 days -Follow-up GC/chlamydia on Pap smear -Follow-up HIV, RPR at subsequent visit.  Orders placed today but she left before blood work could be done.  Cervical cancer screening -Follow-up Pap smear     Mirian Mo, MD [facility]

## 2019-12-24 NOTE — Assessment & Plan Note (Signed)
-  Follow-up Pap smear 

## 2019-12-24 NOTE — Patient Instructions (Signed)
Based on our testing here in the office, it looks like you have bacterial vaginosis.  This is a common vaginal infection.  This is not a sexually transmitted infection.  We will treat you with 5 days of a vaginal suppository with an antibiotic called metronidazole.

## 2019-12-27 ENCOUNTER — Telehealth: Payer: Self-pay

## 2019-12-27 NOTE — Telephone Encounter (Signed)
Patient calls nurse line regarding cost of rx for metrogel. Spoke with pharmacist, states that gel is around $120 without insurance. However, metronidazole pill is around $20. To PCP. Please advise if change is appropriate.   Veronda Prude, RN

## 2019-12-28 LAB — CYTOLOGY - PAP
Chlamydia: NEGATIVE
Comment: NEGATIVE
Comment: NEGATIVE
Comment: NORMAL
Diagnosis: NEGATIVE
High risk HPV: NEGATIVE
Neisseria Gonorrhea: NEGATIVE

## 2019-12-28 MED ORDER — METRONIDAZOLE 500 MG PO TABS: 500.0000 mg | ORAL_TABLET | Freq: Two times a day (BID) | ORAL | 0 refills | Status: DC

## 2019-12-28 NOTE — Telephone Encounter (Signed)
Yes that is an appropriate change.  I have sent in the metronidazole pills.  Please call and let Ms. Limones know the new medication was sent to her pharmacy.  Mirian Mo, MD

## 2019-12-28 NOTE — Telephone Encounter (Signed)
Patient called and informed of below.  ° °Anddy Wingert C Endora Teresi, RN ° °

## 2020-05-10 ENCOUNTER — Other Ambulatory Visit: Payer: Self-pay

## 2020-05-10 DIAGNOSIS — Z20822 Contact with and (suspected) exposure to covid-19: Secondary | ICD-10-CM

## 2020-05-12 LAB — NOVEL CORONAVIRUS, NAA: SARS-CoV-2, NAA: DETECTED — AB

## 2020-05-12 LAB — SARS-COV-2, NAA 2 DAY TAT

## 2020-05-13 ENCOUNTER — Telehealth: Payer: Self-pay | Admitting: Nurse Practitioner

## 2020-05-13 NOTE — Telephone Encounter (Signed)
Called to discuss with Jolene Schimke about Covid symptoms and the use of casirivimab/imdevimab, a combination monoclonal antibody infusion for those with mild to moderate Covid symptoms and at a high risk of hospitalization.     Pt is qualified for this infusion at the [facility] infusion center due to co-morbid conditions (BMI>25, AA, student).   Unable to reach. Message left and Mychart message sent.    Patient Active Problem List   Diagnosis Date Noted  . Bacterial vaginosis 12/24/2019  . Cervical cancer screening 11/23/2019  . Seasonal allergies 03/03/2018  . Nexplanon in place 07/09/2017    Willette Alma, AGPCNP-BC

## 2020-08-07 ENCOUNTER — Ambulatory Visit: Payer: Self-pay | Admitting: Student in an Organized Health Care Education/Training Program

## 2020-08-09 ENCOUNTER — Other Ambulatory Visit (HOSPITAL_COMMUNITY)
Admission: RE | Admit: 2020-08-09 | Discharge: 2020-08-09 | Disposition: A | Discharge status: Home / Self Care | Payer: Self-pay | Source: Ambulatory Visit | Attending: Family Medicine | Admitting: Family Medicine

## 2020-08-09 ENCOUNTER — Ambulatory Visit (INDEPENDENT_AMBULATORY_CARE_PROVIDER_SITE_OTHER): Payer: Self-pay | Admitting: Family Medicine

## 2020-08-09 ENCOUNTER — Telehealth: Payer: Self-pay

## 2020-08-09 ENCOUNTER — Encounter: Payer: Self-pay | Admitting: Family Medicine

## 2020-08-09 ENCOUNTER — Other Ambulatory Visit: Payer: Self-pay

## 2020-08-09 VITALS — BP 98/64 | HR 76 | Wt 181.4 lb

## 2020-08-09 DIAGNOSIS — Z113 Encounter for screening for infections with a predominantly sexual mode of transmission: Secondary | ICD-10-CM | POA: Insufficient documentation

## 2020-08-09 MED ORDER — VALACYCLOVIR HCL 500 MG PO TABS: 500.0000 mg | ORAL_TABLET | Freq: Two times a day (BID) | ORAL | 1 refills | Status: DC

## 2020-08-09 NOTE — Assessment & Plan Note (Signed)
She does not want pelvic exam or swabs so we will do limited sexual transmitted infection testing.  I discussed the limitations of this today.  She says she is not seeing any skin bumps or blisters.  After the visit was completed today she called back and said she thought maybe this was the beginning of a herpes outbreak so I called her in a prescription for that.  Other STI testing results are pending.

## 2020-08-09 NOTE — Telephone Encounter (Signed)
Patient calls nurse line regarding receiving medication for Valtrex for HSV outbreak. Patient reports that she briefly discussed with provider at today's visit.   Please advise if medication can be sent into pharmacy.   To Dr. Charolett Bumpers, RN

## 2020-08-09 NOTE — Telephone Encounter (Signed)
Dear Cliffton [employer] [place] I sent in valtrex which is twice a day for three days

## 2020-08-09 NOTE — Telephone Encounter (Signed)
Called number, no answer, mailbox is full Sunday Spillers, CMA

## 2020-08-09 NOTE — Patient Instructions (Signed)
I will send you a note about your labs 

## 2020-08-09 NOTE — Progress Notes (Signed)
    CHIEF COMPLAINT / HPI: She desires testing for sexually transmitted infections but does not want pelvic exam and does not want any vaginal swabs done today.  Does have history of herpes infection but is not seeing any bumps or blisters.   PERTINENT  PMH / PSH: I have reviewed the patient's medications, allergies, past medical and surgical history, smoking status and updated in the EMR as appropriate.   OBJECTIVE:  BP 98/64   Pulse 76   Wt 181 lb 6.4 oz (82.3 kg)   SpO2 98%   BMI 31.74 kg/m   GENERAL: Well-developed female no acute distress ASSESSMENT / PLAN:   No problem-specific Assessment & Plan notes found for this encounter.   Denny Levy MD

## 2020-08-11 LAB — URINE CYTOLOGY ANCILLARY ONLY
Chlamydia: NEGATIVE
Comment: NEGATIVE
Comment: NEGATIVE
Comment: NORMAL
Neisseria Gonorrhea: NEGATIVE
Trichomonas: NEGATIVE

## 2020-08-14 ENCOUNTER — Other Ambulatory Visit: Payer: Self-pay | Admitting: Family Medicine

## 2020-08-14 DIAGNOSIS — Z113 Encounter for screening for infections with a predominantly sexual mode of transmission: Secondary | ICD-10-CM

## 2020-08-14 NOTE — Telephone Encounter (Signed)
Will wait to hear from pt if she was unable to get Rx. Sunday Spillers, CMA

## 2020-08-14 NOTE — Progress Notes (Signed)
She did not stop and get HIV done so I need to put in new order as that order was released already (per lab)

## 2020-08-15 ENCOUNTER — Encounter: Payer: Self-pay | Admitting: Family Medicine

## 2020-08-15 NOTE — Progress Notes (Signed)
Letter sent.

## 2020-09-06 ENCOUNTER — Telehealth: Payer: Self-pay

## 2020-09-06 ENCOUNTER — Other Ambulatory Visit: Payer: Self-pay

## 2020-09-06 ENCOUNTER — Ambulatory Visit (INDEPENDENT_AMBULATORY_CARE_PROVIDER_SITE_OTHER): Payer: Self-pay | Admitting: Family Medicine

## 2020-09-06 ENCOUNTER — Encounter: Payer: Self-pay | Admitting: Family Medicine

## 2020-09-06 VITALS — BP 110/64 | HR 89 | Ht 63.0 in | Wt 181.5 lb

## 2020-09-06 DIAGNOSIS — L739 Follicular disorder, unspecified: Secondary | ICD-10-CM | POA: Insufficient documentation

## 2020-09-06 DIAGNOSIS — B009 Herpesviral infection, unspecified: Secondary | ICD-10-CM | POA: Insufficient documentation

## 2020-09-06 NOTE — Progress Notes (Signed)
    SUBJECTIVE:   CHIEF COMPLAINT / HPI:   "Outbreak" near vaginal area: Patient calls nurse line reporting a "very bad outbreak." Patient reports she has been diagnosed with HSV, however has not had an outbreak this bad. Patient reports she is going on one month of no relief, despite taking Valtrex.  She reports the outbreak started around 08/14/2020.  She denies any pelvic pain, changes in stooling, UTI symptoms, or abnormal uterine bleeding.  She has Nexplanon for birth control.  PERTINENT  PMH / PSH:  Patient Active Problem List   Diagnosis Date Noted  . HSV (herpes simplex virus) infection 09/06/2020  . Acute folliculitis 09/06/2020  . Bacterial vaginosis 12/24/2019  . Cervical cancer screening 11/23/2019  . Seasonal allergies 03/03/2018  . Nexplanon in place 07/09/2017  . Routine screening for STI (sexually transmitted infection) 07/09/2017    OBJECTIVE:   BP 110/64   Pulse 89   Ht 5\' 3"  (1.6 m)   Wt 181 lb 8 oz (82.3 kg)   LMP 08/30/2020   SpO2 98%   BMI 32.15 kg/m    Physical exam: General: Well-appearing patient, no apparent distress Respiratory: Comfortable work of breathing, speaking complete sentences GU: small 5-62mm erythematous soft fluctuant site appreciated to Right pubic region without drainage or bleeding; Normal-appearing labia minora and majora without lesion, normal-appearing vaginal rugae with scant normal-appearing vaginal discharge, cervix normal in appearance with closed os and without lesion  ASSESSMENT/PLAN:   Acute folliculitis Folliculitis vs ingrown hair appreciated to pubic region. No concern for HSV outbreak.  Patient given the following instructions:  1.  Apply warm wet compress to the affected follicle 15 minutes 3-4 times daily.  You can also soak in a warm bath if you would like to to help soften the area. 2.  I am providing you with a topical antibiotic (Bacitracin) that you can apply to the area to help prevent any further infection.   Please note that the area may soften and then rupture and drain some purulent material.  Continue to keep the site covered to prevent worsening infection. 3.  Avoid shaving in this area as hairs can become ingrown and worsen the infection     11m, DO [facility]

## 2020-09-06 NOTE — Telephone Encounter (Signed)
Patient calls nurse line reporting a "very bad outbreak." Patient reports she has been diagnosed with HSV, however has not had an outbreak this bad. Patient reports she is going on one month of no relief, despite taking Valtrex. Apt made for this afternoon.

## 2020-09-06 NOTE — Assessment & Plan Note (Signed)
Folliculitis vs ingrown hair appreciated to pubic region. No concern for HSV outbreak.  Patient given the following instructions:  1.  Apply warm wet compress to the affected follicle 15 minutes 3-4 times daily.  You can also soak in a warm bath if you would like to to help soften the area. 2.  I am providing you with a topical antibiotic (Bacitracin) that you can apply to the area to help prevent any further infection.  Please note that the area may soften and then rupture and drain some purulent material.  Continue to keep the site covered to prevent worsening infection. 3.  Avoid shaving in this area as hairs can become ingrown and worsen the infection

## 2020-09-06 NOTE — Patient Instructions (Addendum)
Thank you for coming in to see [place] today! Please see below to review our plan for today's visit:  You are having what is known as "folliculitis"  1.  Apply warm wet compress to the affected follicle 15 minutes 3-4 times daily.  You can also soak in a warm bath if you would like to to help soften the area. 2.  I am providing you with a topical antibiotic that you can apply to the area to help prevent any further infection.  Please note that the area may soften and then rupture and drain some purulent material.  Continue to keep the site covered to prevent worsening infection. 3.  Avoid shaving in this area as hairs can become ingrown and worsen the infection.  Please call the clinic at [phone] if your symptoms worsen or you have any concerns. It was our pleasure to serve you!   Dr. Peggyann Shoals [facility]

## 2020-09-25 ENCOUNTER — Encounter: Payer: Self-pay | Admitting: Family Medicine

## 2020-09-26 ENCOUNTER — Encounter: Payer: Self-pay | Admitting: Family Medicine

## 2020-09-27 ENCOUNTER — Other Ambulatory Visit: Payer: Self-pay | Admitting: Family Medicine

## 2021-01-10 ENCOUNTER — Other Ambulatory Visit: Payer: Self-pay | Admitting: Family Medicine

## 2021-01-19 ENCOUNTER — Other Ambulatory Visit: Payer: Self-pay | Admitting: Family Medicine

## 2021-01-20 ENCOUNTER — Other Ambulatory Visit: Payer: Self-pay | Admitting: Family Medicine

## 2021-01-21 ENCOUNTER — Other Ambulatory Visit: Payer: Self-pay | Admitting: Family Medicine

## 2021-01-22 ENCOUNTER — Other Ambulatory Visit: Payer: Self-pay | Admitting: Family Medicine

## 2021-03-13 ENCOUNTER — Ambulatory Visit (INDEPENDENT_AMBULATORY_CARE_PROVIDER_SITE_OTHER): Payer: Self-pay | Admitting: Family Medicine

## 2021-03-13 DIAGNOSIS — Z114 Encounter for screening for human immunodeficiency virus [HIV]: Secondary | ICD-10-CM | POA: Insufficient documentation

## 2021-03-13 DIAGNOSIS — Z5329 Procedure and treatment not carried out because of patient's decision for other reasons: Secondary | ICD-10-CM

## 2021-03-13 DIAGNOSIS — Z91199 Patient's noncompliance with other medical treatment and regimen due to unspecified reason: Secondary | ICD-10-CM | POA: Insufficient documentation

## 2021-03-13 DIAGNOSIS — Z1159 Encounter for screening for other viral diseases: Secondary | ICD-10-CM | POA: Insufficient documentation

## 2021-03-13 DIAGNOSIS — Z113 Encounter for screening for infections with a predominantly sexual mode of transmission: Secondary | ICD-10-CM

## 2021-03-13 NOTE — Progress Notes (Signed)
Patient was a no-show to her 03/13/2021 appointment at the family practice center.  She can reschedule as needed.  Peggyann Shoals, DO [facility], PGY-3 03/13/2021 5:22 PM

## 2021-03-14 ENCOUNTER — Other Ambulatory Visit: Payer: Self-pay

## 2021-03-14 ENCOUNTER — Other Ambulatory Visit (HOSPITAL_COMMUNITY)
Admission: RE | Admit: 2021-03-14 | Discharge: 2021-03-14 | Disposition: A | Discharge status: Home / Self Care | Payer: Self-pay | Source: Ambulatory Visit | Attending: Family Medicine | Admitting: Family Medicine

## 2021-03-14 ENCOUNTER — Ambulatory Visit (INDEPENDENT_AMBULATORY_CARE_PROVIDER_SITE_OTHER): Payer: Self-pay | Admitting: Family Medicine

## 2021-03-14 ENCOUNTER — Encounter: Payer: Self-pay | Admitting: Family Medicine

## 2021-03-14 VITALS — BP 100/68 | HR 91 | Wt 164.0 lb

## 2021-03-14 DIAGNOSIS — Z113 Encounter for screening for infections with a predominantly sexual mode of transmission: Secondary | ICD-10-CM | POA: Insufficient documentation

## 2021-03-14 LAB — POCT WET PREP (WET MOUNT): Clue Cells Wet Prep Whiff POC: POSITIVE

## 2021-03-14 MED ORDER — METRONIDAZOLE 500 MG PO TABS: 500.0000 mg | ORAL_TABLET | Freq: Two times a day (BID) | ORAL | 0 refills | Status: DC

## 2021-03-14 NOTE — Progress Notes (Signed)
    SUBJECTIVE:   CHIEF COMPLAINT / HPI:   STI: Patient planing of a few days of vaginal itching.  No burning.  No discharge.  Sexually active with 1 female partner as of 1 week ago.  Did not use condoms.  Has Nexplanon.  No fevers or chills.  PERTINENT  PMH / PSH: BV  OBJECTIVE:   BP 100/68   Pulse 91   Wt 164 lb (74.4 kg)   SpO2 99%   BMI 29.05 kg/m   General: Alert and oriented.  No acute distress. GU: Normal-appearing labia.  Grayish discharge seen coming from the cervical os.  No bleeding.  Otherwise normal-appearing vagina.  ASSESSMENT/PLAN:   Screening for STD (sexually transmitted disease) Only symptom is a few days of vaginal itching.  Wet prep showed BV and trichomonas.  Provided metronidazole and provided patient forms for expedited partner therapy.  Awaiting blood testing and gonorrhea chlamydia results.  Advised patient to use condoms every time she has intercourse.     Tracey Kitty, MD [facility]

## 2021-03-14 NOTE — Patient Instructions (Signed)
It was nice to see you today,  I will call you with the results when I get them.  If necessary we will treat with antibiotics.  Please continue to use condoms during sexual intercourse as other forms of birth control do not protect against STIs.  Have a great day,  Frederic Jericho, MD

## 2021-03-14 NOTE — Assessment & Plan Note (Signed)
Only symptom is a few days of vaginal itching.  Wet prep showed BV and trichomonas.  Provided metronidazole and provided patient forms for expedited partner therapy.  Awaiting blood testing and gonorrhea chlamydia results.  Advised patient to use condoms every time she has intercourse.

## 2021-03-15 LAB — CERVICOVAGINAL ANCILLARY ONLY
Chlamydia: NEGATIVE
Comment: NEGATIVE
Comment: NORMAL
Neisseria Gonorrhea: NEGATIVE

## 2021-03-15 LAB — RPR: RPR Ser Ql: NONREACTIVE

## 2021-03-15 LAB — HEPATITIS C ANTIBODY: Hep C Virus Ab: <0.1 s/co ratio (ref 0.0–0.9)

## 2021-03-15 LAB — HIV ANTIBODY (ROUTINE TESTING W REFLEX): HIV Screen 4th Generation wRfx: NONREACTIVE

## 2021-04-10 ENCOUNTER — Other Ambulatory Visit: Payer: Self-pay | Admitting: Family Medicine

## 2021-04-11 NOTE — Telephone Encounter (Signed)
Attempted to call patient regarding prescription request for metronidazole. Patient previously examined and diagnosed with BV and trichomonas. Patient did not answer and HIPAA compliant voicemail was left for patient to call the office. We will not be able to fill this medication until patient is evaluated, appears that patient has appointment with access to care on 04/16/2021.   Malli Falotico, DO

## 2021-04-15 NOTE — Progress Notes (Signed)
    SUBJECTIVE:   CHIEF COMPLAINT / HPI: Vaginal itching  Vaginal Discharge: Patient is a 25 y.o. female presenting with vaginal itching for a week plus a white discharge.  She states the discharge is of thick consistency.  She endorses no vaginal odor.  She is interested in screening for sexually transmitted infections today for GC/CT, trich.  1 month ago she was tested for HIV and RPR which were both nonreactive, she were request not to be tested for those today.  She is in a monogamous relationship.  Last month she also tested positive for BV and completed treatment for that with metronidazole.  LMP 2 days ago, she is on nexplanon for birth control.   Pap UTD, NILM, next due April 2024.  PERTINENT  PMH / PSH: None relevant  OBJECTIVE:   BP 96/60   Pulse 63   Ht 5\' 4"  (1.626 m)   Wt 166 lb 2 oz (75.4 kg)   SpO2 99%   BMI 28.52 kg/m    General: NAD, pleasant, able to participate in exam Respiratory: Normal effort, no obvious respiratory distress Pelvic: VULVA: normal appearing vulva with no masses, tenderness or lesions, VAGINA: Normal appearing vagina with normal color, no lesions, with white and thick discharge present. CERVIX: No lesions, no discharge present.  ASSESSMENT/PLAN:   Vaginal itching 25 y.o. female with vaginal discharge for about a week, as well as white thick discharge concerning for candidiasis.  Physical exam significant for the vaginal discharge described.     Plan: -Wet prep pending, must do send out -GC/chlamydia pending    30, MD [facility]

## 2021-04-16 ENCOUNTER — Other Ambulatory Visit (HOSPITAL_COMMUNITY)
Admission: RE | Admit: 2021-04-16 | Discharge: 2021-04-16 | Disposition: A | Discharge status: Home / Self Care | Payer: Self-pay | Source: Ambulatory Visit | Attending: Family Medicine | Admitting: Family Medicine

## 2021-04-16 ENCOUNTER — Other Ambulatory Visit: Payer: Self-pay

## 2021-04-16 ENCOUNTER — Ambulatory Visit (INDEPENDENT_AMBULATORY_CARE_PROVIDER_SITE_OTHER): Payer: Self-pay | Admitting: Family Medicine

## 2021-04-16 VITALS — BP 96/60 | HR 63 | Ht 64.0 in | Wt 166.1 lb

## 2021-04-16 DIAGNOSIS — Z113 Encounter for screening for infections with a predominantly sexual mode of transmission: Secondary | ICD-10-CM

## 2021-04-16 DIAGNOSIS — N898 Other specified noninflammatory disorders of vagina: Secondary | ICD-10-CM

## 2021-04-16 DIAGNOSIS — Z114 Encounter for screening for human immunodeficiency virus [HIV]: Secondary | ICD-10-CM

## 2021-04-16 NOTE — Patient Instructions (Signed)
It was a pleasure to see you today! ? ?We will get some labs today.  If they are abnormal or we need to do something about them, I will call you.  If they are normal, I will send you a message on MyChart (if it is active) or a letter in the mail.  If you don't hear from us in 2 weeks, please call the office  [phone]. ? ? ?Be Well, ? ?Dr. Betania Dizon ? ?

## 2021-04-16 NOTE — Assessment & Plan Note (Signed)
25 y.o. female with vaginal discharge for about a week, as well as white thick discharge concerning for candidiasis.  Physical exam significant for the vaginal discharge described.     Plan: -Wet prep pending, must do send out -GC/chlamydia pending

## 2021-04-17 LAB — CERVICOVAGINAL ANCILLARY ONLY
Bacterial Vaginitis (gardnerella): POSITIVE — AB
Candida Glabrata: NEGATIVE
Candida Vaginitis: POSITIVE — AB
Chlamydia: NEGATIVE
Comment: NEGATIVE
Comment: NEGATIVE
Comment: NEGATIVE
Comment: NEGATIVE
Comment: NEGATIVE
Comment: NORMAL
Neisseria Gonorrhea: NEGATIVE
Trichomonas: POSITIVE — AB

## 2021-04-18 ENCOUNTER — Encounter: Payer: Self-pay | Admitting: Family Medicine

## 2021-04-19 ENCOUNTER — Telehealth: Payer: Self-pay

## 2021-04-19 DIAGNOSIS — B3731 Acute candidiasis of vulva and vagina: Secondary | ICD-10-CM

## 2021-04-19 DIAGNOSIS — N760 Acute vaginitis: Secondary | ICD-10-CM

## 2021-04-19 DIAGNOSIS — B373 Candidiasis of vulva and vagina: Secondary | ICD-10-CM

## 2021-04-19 DIAGNOSIS — B9689 Other specified bacterial agents as the cause of diseases classified elsewhere: Secondary | ICD-10-CM

## 2021-04-19 DIAGNOSIS — A5901 Trichomonal vulvovaginitis: Secondary | ICD-10-CM

## 2021-04-19 MED ORDER — METRONIDAZOLE 500 MG PO TABS: 500.0000 mg | ORAL_TABLET | Freq: Two times a day (BID) | ORAL | 0 refills | Status: DC

## 2021-04-19 MED ORDER — FLUCONAZOLE 150 MG PO TABS: 150.0000 mg | ORAL_TABLET | Freq: Once | ORAL | 0 refills | Status: AC

## 2021-04-19 NOTE — Telephone Encounter (Signed)
Called patient and discussed results with her, unfortunately not able to call her re: STI and possible EPT due to schedule until today. Patient has BV, yeast infection, and trichmonas. Will treat with fluconazole 150 mg x1 and flagyl 500 mg x7d. Discussed not drinking EtOH during flagyl treatment. Patient's partner is not a patient at [facility] and doesn't have a PCP, is a candidate for EPT. Patient needs to get DOB, will call back with partner's name and DOB. Once I have that information, I can send rx for trichomonas tx to [facility]. I counseled patient on not having unprotected intercourse to prevent STIs, as well as not in the next 7 days during treatment especially. She expressed understanding.  Shirlean Mylar, MD [facility], PGY-3

## 2021-04-19 NOTE — Telephone Encounter (Signed)
Patient calls nurse line regarding results from visit on 8/1. Patient has been able to view via mychart and is requesting medication for treatment to be sent to [facility] on [address].   Forwarding to provider  Veronda Prude, RN

## 2021-04-26 ENCOUNTER — Other Ambulatory Visit: Payer: Self-pay

## 2021-04-26 MED ORDER — VALACYCLOVIR HCL 500 MG PO TABS: 500.0000 mg | ORAL_TABLET | Freq: Two times a day (BID) | ORAL | 1 refills | Status: DC

## 2021-05-21 ENCOUNTER — Other Ambulatory Visit: Payer: Self-pay | Admitting: Family Medicine

## 2021-05-21 DIAGNOSIS — B9689 Other specified bacterial agents as the cause of diseases classified elsewhere: Secondary | ICD-10-CM

## 2021-05-21 DIAGNOSIS — A5901 Trichomonal vulvovaginitis: Secondary | ICD-10-CM

## 2021-05-22 ENCOUNTER — Other Ambulatory Visit: Payer: Self-pay | Admitting: Family Medicine

## 2021-05-22 DIAGNOSIS — B3731 Acute candidiasis of vulva and vagina: Secondary | ICD-10-CM

## 2021-05-22 DIAGNOSIS — B373 Candidiasis of vulva and vagina: Secondary | ICD-10-CM

## 2021-05-23 ENCOUNTER — Other Ambulatory Visit: Payer: Self-pay | Admitting: Family Medicine

## 2021-05-23 ENCOUNTER — Encounter (HOSPITAL_COMMUNITY): Payer: Self-pay | Admitting: Emergency Medicine

## 2021-05-23 ENCOUNTER — Ambulatory Visit (HOSPITAL_COMMUNITY)
Admission: EM | Admit: 2021-05-23 | Discharge: 2021-05-23 | Disposition: A | Discharge status: Home / Self Care | Payer: Self-pay | Attending: Internal Medicine | Admitting: Internal Medicine

## 2021-05-23 DIAGNOSIS — B373 Candidiasis of vulva and vagina: Secondary | ICD-10-CM

## 2021-05-23 DIAGNOSIS — N760 Acute vaginitis: Secondary | ICD-10-CM | POA: Insufficient documentation

## 2021-05-23 DIAGNOSIS — B3731 Acute candidiasis of vulva and vagina: Secondary | ICD-10-CM

## 2021-05-23 LAB — POCT URINALYSIS DIPSTICK, ED / UC
Bilirubin Urine: NEGATIVE
Glucose, UA: NEGATIVE mg/dL
Hgb urine dipstick: NEGATIVE
Ketones, ur: NEGATIVE mg/dL
Nitrite: NEGATIVE
Protein, ur: NEGATIVE mg/dL
Specific Gravity, Urine: 1.025 (ref 1.005–1.030)
Urobilinogen, UA: 0.2 mg/dL (ref 0.0–1.0)
pH: 6 (ref 5.0–8.0)

## 2021-05-23 LAB — POC URINE PREG, ED: Preg Test, Ur: NEGATIVE

## 2021-05-23 MED ORDER — FLUCONAZOLE 150 MG PO TABS: 150.0000 mg | ORAL_TABLET | Freq: Once | ORAL | 0 refills | Status: AC

## 2021-05-23 NOTE — ED Triage Notes (Signed)
Pt  Is present today with vaginal irritation and itching. Pt states sx started two days ago.

## 2021-05-23 NOTE — ED Provider Notes (Signed)
[facility]    CSN: [id number] Arrival date & time: 05/23/21  4259      History   Chief Complaint Chief Complaint  Patient presents with   Vaginal Itching   Vaginitis    HPI Tracey [name] is a 25 y.o. female comes to the urgent care with 2-day history of vaginal itching.  Symptoms started couple days ago and has been persistent.  Patient denies any vaginal discharge.  She has some thick whitish discharge in the vaginal area.  No abdominal pain.  No dysuria, urgency or frequency.  Patient is sexually active and engages in unprotected sexual intercourse. 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 Authorizing Provider  fluconazole (DIFLUCAN) 150 MG tablet Take 1 tablet (150 mg total) by mouth once for 1 dose. Take second tablet 3 days after first dose if symptoms persist. 05/23/21 05/23/21 Yes Larico Dimock, Britta Mccreedy, MD  cetirizine (ZYRTEC) 10 MG tablet Take 1 tablet (10 mg total) by mouth daily.  11/23/19   Marthenia Rolling, DO  etonogestrel (NEXPLANON) 68 MG IMPL implant 1 each by Subdermal route once.    [provider]  fluticasone (FLONASE) 50 MCG/ACT nasal spray Place 1 spray into both nostrils daily. 1 spray in each nostril every day 03/03/18   Marquette Saa, MD  Olopatadine HCl 0.2 % SOLN Apply 1 drop to eye daily. 03/03/18   Marquette Saa, MD  valACYclovir (VALTREX) 500 MG tablet Take 1 tablet (500 mg total) by mouth 2 (two) times daily. Until rash has resolved. 04/26/21   Evelena Leyden, DO    Family History Family History  Problem Relation Age of Onset   Hypertension Maternal Grandmother    Asthma Neg Hx    Cancer Neg Hx    Diabetes Neg Hx    Heart disease Neg Hx    Stroke Neg Hx    Hearing loss Neg Hx     Social History Social History   Tobacco Use   Smoking status: Former    Types: Cigars   Smokeless tobacco: Never   Tobacco comments:    quit 2015  Substance Use Topics   Alcohol use: No    Comment: none in over a  year   Drug use: No    Comment: former marijuana, ~ a yr ago     Allergies   Patient has no known allergies.   Review of Systems Review of Systems  Constitutional: Negative.   Gastrointestinal: Negative.   Genitourinary:  Positive for vaginal pain. Negative for dysuria, frequency, menstrual problem, pelvic pain and vaginal discharge.  Neurological: Negative.     Physical Exam Triage Vital Signs ED Triage Vitals  Enc Vitals Group     BP 05/23/21 0911 116/81     Pulse Rate 05/23/21 0911 71     Resp 05/23/21 0911 17     Temp 05/23/21 0911 98.2 F (36.8 C)     Temp src --      SpO2 05/23/21 0911 100 %     Weight --      Height --      Head Circumference --      Peak Flow --      Pain Score 05/23/21 0909 0     Pain Loc --      Pain Edu? --      Excl. in GC? --    No data found.  Updated Vital Signs BP 116/81   Pulse 71   Temp 98.2 F (36.8 C)   Resp 17   SpO2 100%   Breastfeeding No   Visual  Acuity Right Eye Distance:   Left Eye Distance:   Bilateral Distance:    Right Eye Near:   Left Eye Near:    Bilateral Near:     Physical Exam Vitals and nursing note reviewed.  Constitutional:      General: She is not in acute distress.    Appearance: She is not ill-appearing.  Cardiovascular:     Rate and Rhythm: Normal rate and regular rhythm.     Pulses: Normal pulses.     Heart sounds: Normal heart sounds.  Pulmonary:     Effort: Pulmonary effort is normal.     Breath sounds: Normal breath sounds.  Musculoskeletal:        General: Normal range of motion.  Neurological:     Mental Status: She is alert.     UC Treatments / Results  Labs (all labs ordered are listed, but only abnormal results are displayed) Labs Reviewed  POCT URINALYSIS DIPSTICK, ED / UC - Abnormal; Notable for the following components:      Result Value   Leukocytes,Ua SMALL (*)    All other components within normal limits  POC URINE PREG, ED  CERVICOVAGINAL ANCILLARY ONLY    EKG   Radiology No results found.  Procedures Procedures (including critical care time)  Medications Ordered in UC Medications - No data to display  Initial Impression / Assessment and Plan / UC Course  I have reviewed the triage vital signs and the nursing notes.  Pertinent labs & imaging results that were available during my care of the patient were reviewed by me and considered in my medical decision making (see chart for details).     1.  Acute vaginitis: Cervicovaginal swab for GC/chlamydia/trichomonas/bacterial vaginosis/vaginal yeast infection Point-of-care urine is negative for UTI Urine pregnancy test is negative We will call patient with recommendations if labs are abnormal. Final Clinical Impressions(s) / UC Diagnoses   Final diagnoses:  Acute vaginitis     Discharge Instructions      Take medications as prescribed We will call you with recommendations if labs are abnormal Please abstain  from sexual intercourse  until the lab results are available Return to urgent care if symptoms worsen.   ED Prescriptions     Medication Sig Dispense Auth. Provider   fluconazole (DIFLUCAN) 150 MG tablet Take 1 tablet (150 mg total) by mouth once for 1 dose. Take second tablet 3 days after first dose if symptoms persist. 2 tablet Gerrick Ray, Britta Mccreedy, MD      PDMP not reviewed this encounter.   Merrilee Jansky, MD 05/23/21 [phone]

## 2021-05-23 NOTE — Discharge Instructions (Addendum)
Take medications as prescribed We will call you with recommendations if labs are abnormal Please abstain from sexual intercourse until the lab results are available Return to urgent care if symptoms worsen.

## 2021-05-24 ENCOUNTER — Encounter: Payer: Self-pay | Admitting: Family Medicine

## 2021-05-24 LAB — CERVICOVAGINAL ANCILLARY ONLY
Bacterial Vaginitis (gardnerella): POSITIVE — AB
Candida Glabrata: NEGATIVE
Candida Vaginitis: NEGATIVE
Chlamydia: NEGATIVE
Comment: NEGATIVE
Comment: NEGATIVE
Comment: NEGATIVE
Comment: NEGATIVE
Comment: NEGATIVE
Comment: NORMAL
Neisseria Gonorrhea: NEGATIVE
Trichomonas: POSITIVE — AB

## 2021-05-25 MED ORDER — METRONIDAZOLE 500 MG PO TABS: 500.0000 mg | ORAL_TABLET | Freq: Two times a day (BID) | ORAL | 0 refills | Status: DC

## 2021-05-31 ENCOUNTER — Ambulatory Visit: Payer: Self-pay

## 2021-06-06 ENCOUNTER — Ambulatory Visit (INDEPENDENT_AMBULATORY_CARE_PROVIDER_SITE_OTHER): Payer: Self-pay | Admitting: Family Medicine

## 2021-06-06 ENCOUNTER — Other Ambulatory Visit: Payer: Self-pay

## 2021-06-06 ENCOUNTER — Other Ambulatory Visit (HOSPITAL_COMMUNITY)
Admission: RE | Admit: 2021-06-06 | Discharge: 2021-06-06 | Disposition: A | Discharge status: Home / Self Care | Payer: Self-pay | Source: Ambulatory Visit | Attending: Family Medicine | Admitting: Family Medicine

## 2021-06-06 VITALS — BP 104/70 | HR 75 | Ht 64.0 in | Wt 166.8 lb

## 2021-06-06 DIAGNOSIS — Z202 Contact with and (suspected) exposure to infections with a predominantly sexual mode of transmission: Secondary | ICD-10-CM | POA: Insufficient documentation

## 2021-06-06 DIAGNOSIS — Z113 Encounter for screening for infections with a predominantly sexual mode of transmission: Secondary | ICD-10-CM

## 2021-06-06 DIAGNOSIS — Z975 Presence of (intrauterine) contraceptive device: Secondary | ICD-10-CM

## 2021-06-06 DIAGNOSIS — Z3009 Encounter for other general counseling and advice on contraception: Secondary | ICD-10-CM | POA: Insufficient documentation

## 2021-06-06 LAB — POCT WET PREP (WET MOUNT)
Clue Cells Wet Prep Whiff POC: POSITIVE
Trichomonas Wet Prep HPF POC: ABSENT

## 2021-06-06 LAB — POCT URINE PREGNANCY: Preg Test, Ur: NEGATIVE

## 2021-06-06 MED ORDER — METRONIDAZOLE 500 MG PO TABS: 500.0000 mg | ORAL_TABLET | Freq: Two times a day (BID) | ORAL | 0 refills | Status: AC

## 2021-06-06 NOTE — Patient Instructions (Addendum)
Thank you for coming to see me today. It was a pleasure. We performed STD testing today. This will take a few days to come back. If your MyChart is activated, we will message you on there if everything is normal, otherwise we will call. If we need to treat something we will also call you. If you do not hear from [place] in the next 4 days, please give [place] a call.   I have sent flagyl to the pharmacy. I will let you know if you need to take this.   I will see you tomorrow for nexplanon removal and insertion! I recommended using condoms to prevent STD.  If you have any questions or concerns, please do not hesitate to call the office at [phone].  Best wishes,   Dr Allena Katz

## 2021-06-06 NOTE — Progress Notes (Signed)
     SUBJECTIVE:   CHIEF COMPLAINT / HPI:   Tracey [name] is a 25 y.o. female presents for STD testing  STD testing Pt desires STD testing. Sexually active with one female. Recently treated for trich and BV last month, missed 1 day of metrondazole.   Birth control  Nexplanon expired last year (inserted in 2018) but is still in her left arm. Pt desires nexplanon again. Pt is not on any other form of birth control. LMP: early September.   Declined flu vaccine today.   Flowsheet Row Office Visit from 06/06/2021 in [facility]  PHQ-9 Total Score 0       PERTINENT  PMH / PSH: trichomonas, bacterial vaginosis, seasonal allergies   OBJECTIVE:   BP 104/70   Pulse 75   Ht 5\' 4"  (1.626 m)   Wt 166 lb 12.8 oz (75.7 kg)   SpO2 98%   BMI 28.63 kg/m    General: Alert, no acute distress, pleasant, well appearing  Cardio: well perfused  Pulm: normal work of breathing Neuro: Cranial nerves grossly intact    Pelvic Exam chaperoned by CMA April         External: normal female genitalia without lesions or masses        Vagina: normal without lesions or masses        Cervix: normal without lesions or masses        Samples for Wet prep, GC/Chlamydia obtained   ASSESSMENT/PLAN:   Nexplanon in place Nexplanon has now expired. Recommended removal. Pt would also like it reinserted too. Booked pt for nexplanon removal and insertion with myself tomorrow afternoon.   Screening for STD (sexually transmitted disease) Obtained wet prep and std swabs today. Urine preg negative. Wet prep showed positive whiff and clue cells. Sent in 7 days of flagyl to the pharmacy.     May, MD PGY-3 [facility]

## 2021-06-06 NOTE — Assessment & Plan Note (Signed)
Obtained wet prep and std swabs today. Urine preg negative. Wet prep showed positive whiff and clue cells. Sent in 7 days of flagyl to the pharmacy.

## 2021-06-06 NOTE — Assessment & Plan Note (Addendum)
Nexplanon has now expired. Recommended removal. Pt would also like it reinserted too. Booked pt for nexplanon removal and insertion with myself tomorrow afternoon.

## 2021-06-07 ENCOUNTER — Telehealth: Payer: Self-pay | Admitting: Family Medicine

## 2021-06-07 ENCOUNTER — Ambulatory Visit: Payer: Self-pay | Admitting: Family Medicine

## 2021-06-07 LAB — CERVICOVAGINAL ANCILLARY ONLY
Chlamydia: NEGATIVE
Comment: NEGATIVE
Comment: NEGATIVE
Comment: NORMAL
Neisseria Gonorrhea: NEGATIVE
Trichomonas: NEGATIVE

## 2021-06-07 NOTE — Progress Notes (Deleted)
     SUBJECTIVE:   CHIEF COMPLAINT / HPI:   Tracey [name] is a 25 y.o. female presents for nexplanon removal and insertion  Pt desires nexplanon removal and insertion today. Inserted in 2018 in right arm and expired last year. Urine pregnancy negative in clinic yesterday.   ***  Flowsheet Row Office Visit from 06/06/2021 in [facility]  PHQ-9 Total Score 0        Health Maintenance Due  Topic   HPV VACCINES (3 - 3-dose series)   INFLUENZA VACCINE       PERTINENT  PMH / PSH:   OBJECTIVE:   There were no vitals taken for this visit.   General: Alert, no acute distress Cardio: Normal S1 and S2, RRR, no r/m/g Pulm: CTAB, normal work of breathing Abdomen: Bowel sounds normal. Abdomen soft and non-tender.  Extremities: No peripheral edema.  Neuro: Cranial nerves grossly intact   ASSESSMENT/PLAN:   No problem-specific Assessment & Plan notes found for this encounter.    Towanda Octave, MD PGY-3 [facility]

## 2021-06-07 NOTE — Telephone Encounter (Signed)
Called pt as she no showed for appointment today. She said she forgot. I explained the no show clinic policy to her. She would like to reschedule the appointment for next week. I said she would have to call the front desk to reschedule.

## 2021-07-05 ENCOUNTER — Other Ambulatory Visit: Payer: Self-pay | Admitting: Family Medicine

## 2021-07-05 ENCOUNTER — Ambulatory Visit: Payer: Self-pay

## 2021-07-05 NOTE — Progress Notes (Deleted)
    SUBJECTIVE:   CHIEF COMPLAINT / HPI: STI testing  ***  PERTINENT  PMH / PSH: ***  OBJECTIVE:   There were no vitals taken for this visit.  General: ***, NAD CV: RRR, no murmurs*** Pulm: CTAB, no wheezes or rales  ASSESSMENT/PLAN:   No problem-specific Assessment & Plan notes found for this encounter.     Littie Deeds, MD [facility]   {    This will disappear when note is signed, click to select method of visit    :1}

## 2021-07-06 NOTE — Telephone Encounter (Signed)
Called patient regarding request for Flagyl prescription for BV. Patient was recently treated for BV and trichomoniasis in the last month. As she is having discharge and discomfort, requested that patient get another clinic appointment as the discharge may be more than typical BV and needs evaluation. Patient agreeable and will reach out to the clinic to schedule an appointment. She also has an appointment with the colposcopy clinic on 10/27.   Laurella Tull, DO

## 2021-07-12 ENCOUNTER — Other Ambulatory Visit: Payer: Self-pay

## 2021-07-12 ENCOUNTER — Other Ambulatory Visit (HOSPITAL_COMMUNITY)
Admission: RE | Admit: 2021-07-12 | Discharge: 2021-07-12 | Disposition: A | Discharge status: Home / Self Care | Payer: Self-pay | Source: Ambulatory Visit | Attending: Family Medicine | Admitting: Family Medicine

## 2021-07-12 ENCOUNTER — Ambulatory Visit (INDEPENDENT_AMBULATORY_CARE_PROVIDER_SITE_OTHER): Payer: Self-pay | Admitting: Family Medicine

## 2021-07-12 VITALS — BP 121/77 | HR 75 | Ht 64.0 in | Wt 166.4 lb

## 2021-07-12 DIAGNOSIS — A599 Trichomoniasis, unspecified: Secondary | ICD-10-CM

## 2021-07-12 DIAGNOSIS — Z3046 Encounter for surveillance of implantable subdermal contraceptive: Secondary | ICD-10-CM

## 2021-07-12 DIAGNOSIS — Z975 Presence of (intrauterine) contraceptive device: Secondary | ICD-10-CM

## 2021-07-12 LAB — POCT WET PREP (WET MOUNT): Clue Cells Wet Prep Whiff POC: NEGATIVE

## 2021-07-12 LAB — POCT URINE PREGNANCY: Preg Test, Ur: NEGATIVE

## 2021-07-12 MED ORDER — ETONOGESTREL 68 MG ~~LOC~~ IMPL
68.0000 mg | DRUG_IMPLANT | Freq: Once | SUBCUTANEOUS | Status: AC
  Administered 2021-07-12: 68 mg via SUBCUTANEOUS

## 2021-07-12 MED ORDER — METRONIDAZOLE 500 MG PO TABS: 500.0000 mg | ORAL_TABLET | Freq: Two times a day (BID) | ORAL | 0 refills | Status: AC

## 2021-07-12 NOTE — Progress Notes (Signed)
    CHIEF COMPLAINT / HPI: Patient here for removal of old Nexplanon and reinsertion of new 1.  Also wants to know if she can be checked for the STD, specifically trichomonas.  Says one of her partners was recently diagnosed with that and she has had some discharge that seemed like it had a different smell.   PERTINENT  PMH / PSH: I have reviewed the patient's medications, allergies, past medical and surgical history, smoking status and updated in the EMR as appropriate. Nexplanon placed in left arm in year 2018   OBJECTIVE:  BP 121/77   Pulse 75   Ht 5\' 4"  (1.626 m)   Wt 166 lb 6.4 oz (75.5 kg)   SpO2 100%   BMI 28.56 kg/m  GEN WD WN NAD GU: cervix normal, thin white discharge, scant.   Pregnancy test negative LMP > 7 days  ago  PROCEDURE NOTE: NEXPLANON  REMOVAL and REINSERTION  REMOVAA Patient given informed consent and signed copy in the chart for both procedures. an appropriate time out was been taken  RIGHT  arm area prepped and draped in the usual sterile fashion. Three cc of 1% lidocaine without epinephrine used for local anesthesia. A small stab incision was made close to the nexplanon with scalpel. Hemostats were used to withdraw the nexplanon  INSERTION new NEXPLANON  Prior to the removal procedure, an appropriate time out had been taken and  there were no breaks in patient care between the procedures. Sterile field had been maintained as well as local anesthesia.   Nexplanon was inserted in typical fashion in the  RIGHT ARM (same site previously used) and where removal was performed. There were no complications.  Pressure bandage was  applied to decrease bruising. Patient given follow up instructions should she experience redness, swelling at sight or fever in the next 24 hours. Patient given Nexplanon pocket card.  Since LMP > 7 days ago she is recommended to use condoms as  a back up contraceptive for the next 7 days. ASSESSMENT / PLAN:   No problem-specific  Assessment & Plan notes found for this encounter.   MD

## 2021-07-12 NOTE — Addendum Note (Signed)
Addended by: Veronda Prude on: 07/12/2021 02:38 PM   Modules accepted: Orders

## 2021-07-12 NOTE — Patient Instructions (Signed)
I have sent in a prescription to treat the trichomonas infection.  I would recommend you come back 2 to 3 weeks after you complete that medicine and lets do a test of cure to make sure that we have indeed cleared it up.  All of your sexual partners need to be treated before you have sexual intercourse with them again even if they are asymptomatic.  We will let you know about the results of your other STI testing.  We also placed her Nexplanon today.  Let us know if you have questions or problems.

## 2021-07-13 LAB — CERVICOVAGINAL ANCILLARY ONLY
Chlamydia: NEGATIVE
Comment: NEGATIVE
Comment: NORMAL
Neisseria Gonorrhea: NEGATIVE

## 2021-08-03 ENCOUNTER — Ambulatory Visit: Payer: Self-pay | Admitting: Student

## 2021-08-03 NOTE — Progress Notes (Deleted)
    SUBJECTIVE:   CHIEF COMPLAINT / HPI:      PERTINENT  PMH / PSH: HSV, Trichomonas, BV, Candid Trich: 4x in 2022 BV: 4 x in 2022 Candida x 1 in 2022 Full patient history: Trichomonas 07/12/21, BV 06/06/21, Trichomonas and BV 05/23/21, Trichomans/BV/Candida 04/16/21, Trichomonas/BV in 03/14/21 and BV 12/24/19 & 08/11/19  Nexplanon removed and re-placed on last visit 07/12/21  OBJECTIVE:   There were no vitals taken for this visit.  ***  ASSESSMENT/PLAN:   No problem-specific Assessment & Plan notes found for this encounter.     Tracey Kinds, MD [facility]

## 2021-08-07 ENCOUNTER — Encounter: Payer: Self-pay | Admitting: Family Medicine

## 2021-08-07 ENCOUNTER — Other Ambulatory Visit: Payer: Self-pay

## 2021-08-07 ENCOUNTER — Ambulatory Visit (INDEPENDENT_AMBULATORY_CARE_PROVIDER_SITE_OTHER): Payer: Self-pay | Admitting: Family Medicine

## 2021-08-07 ENCOUNTER — Other Ambulatory Visit (HOSPITAL_COMMUNITY)
Admission: RE | Admit: 2021-08-07 | Discharge: 2021-08-07 | Disposition: A | Discharge status: Home / Self Care | Payer: Self-pay | Source: Ambulatory Visit | Attending: Family Medicine | Admitting: Family Medicine

## 2021-08-07 VITALS — BP 112/72 | HR 83 | Wt 168.4 lb

## 2021-08-07 DIAGNOSIS — Z113 Encounter for screening for infections with a predominantly sexual mode of transmission: Secondary | ICD-10-CM | POA: Insufficient documentation

## 2021-08-07 LAB — POCT WET PREP (WET MOUNT)
Clue Cells Wet Prep Whiff POC: NEGATIVE
Trichomonas Wet Prep HPF POC: ABSENT
WBC, Wet Prep HPF POC: >20

## 2021-08-07 NOTE — Assessment & Plan Note (Signed)
Possible exposure. No signs on physical.  Cultures taken.  Declined blood tests.  Counseled on prevention

## 2021-08-07 NOTE — Patient Instructions (Signed)
Good to see you today - Thank you for coming in  Things we discussed today:  We will contact you about your cultures.  Always use condoms to reduce the risk of STDs  Please always bring your medication bottles

## 2021-08-07 NOTE — Progress Notes (Signed)
    SUBJECTIVE:   CHIEF COMPLAINT / HPI:   STD  Would like to be checked.  No definite discharge or sores or pain.  No specific exposure to STD that she is aware.    PERTINENT  PMH / PSH: recent nexplanon replacement  OBJECTIVE:   BP 112/72   Pulse 83   Wt 168 lb 6.4 oz (76.4 kg)   SpO2 100%   BMI 28.91 kg/m   Genitalia:  Normal introitus for age, no external lesions, no vaginal discharge, mucosa pink and moist, no vaginal or cervical lesions, no vaginal atrophy, no friaility or hemorrhage, small amount of dark material consistent with blood in posterior vaginal area  ASSESSMENT/PLAN:   Screening for STD (sexually transmitted disease) Possible exposure. No signs on physical.  Cultures taken.  Declined blood tests.  Counseled on prevention      Carney Living, MD [facility]

## 2021-08-08 LAB — CERVICOVAGINAL ANCILLARY ONLY
Chlamydia: NEGATIVE
Comment: NEGATIVE
Comment: NORMAL
Neisseria Gonorrhea: NEGATIVE

## 2021-08-27 ENCOUNTER — Ambulatory Visit (INDEPENDENT_AMBULATORY_CARE_PROVIDER_SITE_OTHER): Payer: Self-pay | Admitting: Family Medicine

## 2021-08-27 DIAGNOSIS — Z91199 Patient's noncompliance with other medical treatment and regimen due to unspecified reason: Secondary | ICD-10-CM

## 2021-08-27 NOTE — Progress Notes (Signed)
No show

## 2021-09-28 ENCOUNTER — Ambulatory Visit (INDEPENDENT_AMBULATORY_CARE_PROVIDER_SITE_OTHER): Payer: Self-pay | Admitting: Family Medicine

## 2021-09-28 ENCOUNTER — Encounter: Payer: Self-pay | Admitting: Family Medicine

## 2021-09-28 ENCOUNTER — Other Ambulatory Visit: Payer: Self-pay

## 2021-09-28 ENCOUNTER — Other Ambulatory Visit (HOSPITAL_COMMUNITY)
Admission: RE | Admit: 2021-09-28 | Discharge: 2021-09-28 | Disposition: A | Discharge status: Home / Self Care | Payer: Self-pay | Source: Ambulatory Visit | Attending: Family Medicine | Admitting: Family Medicine

## 2021-09-28 VITALS — BP 118/82 | HR 94 | Ht 64.0 in | Wt 166.0 lb

## 2021-09-28 DIAGNOSIS — Z114 Encounter for screening for human immunodeficiency virus [HIV]: Secondary | ICD-10-CM

## 2021-09-28 DIAGNOSIS — Z7251 High risk heterosexual behavior: Secondary | ICD-10-CM

## 2021-09-28 DIAGNOSIS — Z113 Encounter for screening for infections with a predominantly sexual mode of transmission: Secondary | ICD-10-CM

## 2021-09-28 DIAGNOSIS — N898 Other specified noninflammatory disorders of vagina: Secondary | ICD-10-CM

## 2021-09-28 LAB — POCT WET PREP (WET MOUNT)
Clue Cells Wet Prep Whiff POC: NEGATIVE
Trichomonas Wet Prep HPF POC: ABSENT

## 2021-09-28 NOTE — Progress Notes (Signed)
° ° °  SUBJECTIVE:   CHIEF COMPLAINT / HPI:   STD screening Small white vaginal discharge with mild pruritis. Has a different partner since her last Trichomoniasis testing and would like to make sure she is not having an infection.  Nexplanon was removed and replaced on 07/12/2021. Patient has noted an increased amount of spotting, she notes no worsening of her periods but that she will typically have a little bit of blood every single day.  She has normal periods otherwise, they have not worsened and have not become more painful.  PERTINENT  PMH / PSH: Reviewed  OBJECTIVE:   Ht 5\' 4"  (1.626 m)    Wt 166 lb (75.3 kg)    BMI 28.49 kg/m   General: NAD, well-appearing, well-nourished Respiratory: No respiratory distress, breathing comfortably, able to speak in full sentences Skin: warm and dry, no rashes noted on exposed skin Psych: Appropriate affect and mood Pelvic exam: VULVA: normal appearing vulva with no masses, tenderness or lesions, VAGINA: normal appearing vagina with normal color and discharge, no lesions, CERVIX: normal appearing cervix with blood present at the os and in the canal, WET MOUNT done - results: negative for pathogens, normal epithelial cells, exam chaperoned by , CMA.  ASSESSMENT/PLAN:   STD screening   High risk sexual behavior Patient tested with wet-prep, GC/Ch, HIV, and syphilis. Wet prep was not significant for signs of infection - Follow-up GC/Ch testing  - Encouraged condom use for safe sex practices  Increased vaginal bleeding   Nexplanon in place Counseled patient on spotting risks with Nexplanon placement.  Patient agreeable to monitor for the next few months and follow-up if she would like it removed.  Counseled on other contraception options if she decides for removal  Cleatrice Burke, DO [facility]

## 2021-09-28 NOTE — Patient Instructions (Addendum)
It was so great seeing you today! Today we discussed the following:  - Your testing in the office so far has been negative, some of the tests will take a few days to come back.  - Your bleeding should hopefully level out over the next few months, if it does not and you wish to pursue a different birth control option we can consider IUD, patches, Depo, or pills.    Please make sure to bring any medications you take to your appointments. If you have any questions or concerns please call the office at [phone].   If any tests were collected today, I will notify you of results via MyChart, phone call, or letter. Please contact the office if you have not heard back about results within 2 weeks.

## 2021-10-01 LAB — CERVICOVAGINAL ANCILLARY ONLY
Chlamydia: NEGATIVE
Comment: NEGATIVE
Comment: NEGATIVE
Comment: NORMAL
Neisseria Gonorrhea: NEGATIVE
Trichomonas: NEGATIVE

## 2021-10-02 ENCOUNTER — Telehealth: Payer: Self-pay

## 2021-10-16 ENCOUNTER — Other Ambulatory Visit: Payer: Self-pay | Admitting: Family Medicine

## 2021-11-05 NOTE — Progress Notes (Unsigned)
° ° °  SUBJECTIVE:   CHIEF COMPLAINT / HPI:   Vaginal Discharge: Patient is a 26 y.o. female presenting with vaginal discharge for *** days.  She states the discharge is of *** consistency.  She endorses *** vaginal odor.  She is interested in screening for sexually transmitted infections today. Patient is UTD with pap smear, NILM and HPV negative in 2021. Not due until 2024.  PERTINENT  PMH / PSH: ***None relevant  OBJECTIVE:   There were no vitals taken for this visit.   General: NAD, pleasant, able to participate in exam Respiratory: Normal effort, no obvious respiratory distress Pelvic: VULVA: normal appearing vulva with no masses, tenderness or lesions, VAGINA: Normal appearing vagina with normal color, no lesions, with {GYN VAGINAL DISCHARGE:21986} discharge present, ***CERVIX: No lesions, {GYN VAGINAL DISCHARGE:21986} discharge present,  ASSESSMENT/PLAN:   No problem-specific Assessment & Plan notes found for this encounter.    Assessment:  26 y.o. female with vaginal discharge for***days, as well as***.  Physical exam significant for*** discharge.  Wet prep performed today shows *** consistent with ***.  Patient is interested in STI screening.   Plan: -Wet prep as above.  Will treat with***. -GC/chlamydia pending -Will check HIV and RPR  Shirlean Mylar, MD [facility]    This note was prepared using Dragon voice recognition software and may include unintentional dictation errors due to the inherent limitations of voice recognition software.

## 2021-11-06 ENCOUNTER — Ambulatory Visit: Payer: Self-pay

## 2021-11-08 ENCOUNTER — Ambulatory Visit: Payer: Self-pay

## 2021-11-09 ENCOUNTER — Encounter: Payer: Self-pay | Admitting: Family Medicine

## 2021-11-09 NOTE — Progress Notes (Signed)
Patient has no-showed to multiple appointments in a 6-month period. Per our no-show policy, a letter has been routed to [facility] Admin to be mailed to patient regarding likely dismissal for repeat no show. Will CC to PCP.  ° °

## 2022-01-03 ENCOUNTER — Ambulatory Visit: Payer: Self-pay | Admitting: Family Medicine

## 2022-01-04 ENCOUNTER — Ambulatory Visit (INDEPENDENT_AMBULATORY_CARE_PROVIDER_SITE_OTHER): Payer: Self-pay | Admitting: Family Medicine

## 2022-01-04 ENCOUNTER — Encounter: Payer: Self-pay | Admitting: Family Medicine

## 2022-01-04 DIAGNOSIS — L290 Pruritus ani: Secondary | ICD-10-CM | POA: Insufficient documentation

## 2022-01-04 MED ORDER — HYDROCORTISONE 0.5 % EX CREA: 1.0000 "application " | TOPICAL_CREAM | Freq: Two times a day (BID) | CUTANEOUS | 0 refills | Status: AC

## 2022-01-04 MED ORDER — BALMEX ADULT CARE 11.3 % EX CREA: 1.0000 "application " | TOPICAL_CREAM | Freq: Two times a day (BID) | CUTANEOUS | 0 refills | Status: AC

## 2022-01-04 NOTE — Patient Instructions (Signed)
Thank you for coming to see me today. It was a pleasure. Today we discussed your rectal itching.  Your exam was normal today-I did not see any hemorrhoids which is reassuring I recommend: ?-Keeping the area nice and clean and dry ?-Avoid excessive wiping of the area ?-After a bowel movement make sure you clean the area thoroughly without soap, water will be fine ?-I prescribed zinc oxide cream and also hydrocortisone cream, try zinc oxide cream first and if there is no improvement in symptoms then try the hydrocortisone cream ?-Cotton underwear and clothes ?-Avoid tight fitting clothes ? ?Please follow-up with PCP if no improvement in symptoms ? ?If you have any questions or concerns, please do not hesitate to call the office at [phone]. ? ?Best wishes,  ? ?Dr Allena Katz   ?

## 2022-01-04 NOTE — Progress Notes (Signed)
ANus ?

## 2022-01-04 NOTE — Assessment & Plan Note (Addendum)
Broad differential for anal itching including hemorrhoids, fissures, fistulas, pinworms etc. Normal anal exam with anoscopy today.  Recommended zinc oxide cream with hydrocortisone cream to the areas as needed. If no improvement in symptoms recommended tape test for pinworms. Pt can bring tape pressed against rectal skin (sample first thing in the morning). ? ?

## 2022-01-04 NOTE — Progress Notes (Signed)
     SUBJECTIVE:   CHIEF COMPLAINT / HPI:   Tracey [name] is a 26 y.o. female presents for anal itching  Anal itching  Pt reports anal itching for the last 3 days. Does not feel any lumps in the area. Does not hurt when she wipes. Does have a hx of hemorrhoids when she was pregnant. Stool is type 4 on bristol stool chart. Denies constipation, rectal bleeding or melena.  Sexually active with on female, nexplanon in place. Denies anal intercourse.    Flowsheet Row Office Visit from 01/04/2022 in [facility]  PHQ-9 Total Score 0        Health Maintenance Due  Topic   COVID-19 Vaccine (1)      PERTINENT  PMH / PSH: BV, HSV  OBJECTIVE:   BP 124/83   Pulse 99   Ht 5\' 4"  (1.626 m)   Wt 176 lb (79.8 kg)   SpO2 99%   BMI 30.21 kg/m    General: Alert, no acute distress Cardio: well perfused  Pulm: normal work of breathing Neuro: Cranial nerves grossly intact   Anoscopy chaperoned by CMA April No hemorrhoids, fissures seen Healthy rectal mucosa  ASSESSMENT/PLAN:   Anal itching Broad differential for anal itching including hemorrhoids, fissures, fistulas, pinworms etc. Normal anal exam with anoscopy today.  Recommended zinc oxide cream with hydrocortisone cream to the areas as needed. If no improvement in symptoms recommended tape test for pinworms. Pt can bring tape pressed against rectal skin (sample first thing in the morning).    May, MD PGY-3 [facility]

## 2022-02-01 NOTE — Telephone Encounter (Signed)
Sent to pcp for request. No need to send medication

## 2022-02-19 ENCOUNTER — Other Ambulatory Visit (HOSPITAL_COMMUNITY)
Admission: RE | Admit: 2022-02-19 | Discharge: 2022-02-19 | Disposition: A | Discharge status: Home / Self Care | Payer: Self-pay | Source: Ambulatory Visit | Attending: Family Medicine | Admitting: Family Medicine

## 2022-02-19 ENCOUNTER — Ambulatory Visit (INDEPENDENT_AMBULATORY_CARE_PROVIDER_SITE_OTHER): Payer: Self-pay | Admitting: Family Medicine

## 2022-02-19 ENCOUNTER — Encounter: Payer: Self-pay | Admitting: Family Medicine

## 2022-02-19 ENCOUNTER — Encounter: Payer: Self-pay | Admitting: *Deleted

## 2022-02-19 VITALS — BP 118/64 | HR 100 | Ht 64.0 in | Wt 181.0 lb

## 2022-02-19 DIAGNOSIS — N898 Other specified noninflammatory disorders of vagina: Secondary | ICD-10-CM | POA: Insufficient documentation

## 2022-02-19 LAB — POCT WET PREP (WET MOUNT)
Clue Cells Wet Prep Whiff POC: NEGATIVE
Trichomonas Wet Prep HPF POC: ABSENT

## 2022-02-19 NOTE — Patient Instructions (Addendum)
I will message you with results.

## 2022-02-19 NOTE — Progress Notes (Signed)
    SUBJECTIVE:   CHIEF COMPLAINT / HPI:   Patient reports white, clumpy vaginal discharge a few days ago and was concerned that she may have an infection. She does not have any new partners.  PERTINENT  PMH / PSH: Reviewed  OBJECTIVE:   BP 118/64   Pulse 100   Ht 5\' 4"  (1.626 m)   Wt 181 lb (82.1 kg)   SpO2 99%   BMI 31.07 kg/m   General: NAD, well-appearing, well-nourished Respiratory: No respiratory distress, breathing comfortably, able to speak in full sentences Skin: warm and dry, no rashes noted on exposed skin Psych: Appropriate affect and mood Pelvic exam: VULVA: normal appearing vulva with no masses, tenderness or lesions, VAGINA: vaginal discharge - creamy and bloody, CERVIX: normal appearing cervix with bloody discharge, WET MOUNT done - results: negative for pathogens, normal epithelial cells, exam chaperoned by , CMA.  ASSESSMENT/PLAN:   Vaginal discharge Has not recurred in the last day or so. Wet-prep was negative but still awaiting results from gonorrhea and chlamydia testing. - Patient to return if worsening discharge - Follow-up gonorrhea/chlamydia    Feliz Beam, DO [place] [facility]

## 2022-02-20 LAB — CERVICOVAGINAL ANCILLARY ONLY
Chlamydia: NEGATIVE
Comment: NEGATIVE
Comment: NORMAL
Neisseria Gonorrhea: NEGATIVE

## 2022-04-13 ENCOUNTER — Other Ambulatory Visit: Payer: Self-pay | Admitting: Family Medicine

## 2022-04-15 ENCOUNTER — Other Ambulatory Visit: Payer: Self-pay | Admitting: Family Medicine

## 2022-06-07 ENCOUNTER — Other Ambulatory Visit: Payer: Self-pay | Admitting: Family Medicine

## 2022-11-28 ENCOUNTER — Other Ambulatory Visit: Payer: Self-pay | Admitting: Family Medicine

## 2023-04-22 ENCOUNTER — Other Ambulatory Visit: Payer: Self-pay

## 2023-04-24 ENCOUNTER — Encounter: Payer: Self-pay | Admitting: Family Medicine

## 2023-10-08 ENCOUNTER — Ambulatory Visit: Payer: Medicaid Other

## 2023-10-10 ENCOUNTER — Ambulatory Visit: Payer: Medicaid Other | Admitting: Student

## 2023-10-15 ENCOUNTER — Ambulatory Visit (INDEPENDENT_AMBULATORY_CARE_PROVIDER_SITE_OTHER): Payer: BC Managed Care – PPO | Admitting: Family Medicine

## 2023-10-15 VITALS — BP 128/82 | HR 86 | Ht 64.0 in | Wt 189.8 lb

## 2023-10-15 DIAGNOSIS — R21 Rash and other nonspecific skin eruption: Secondary | ICD-10-CM

## 2023-10-15 DIAGNOSIS — Z3046 Encounter for surveillance of implantable subdermal contraceptive: Secondary | ICD-10-CM | POA: Diagnosis not present

## 2023-10-15 DIAGNOSIS — L729 Follicular cyst of the skin and subcutaneous tissue, unspecified: Secondary | ICD-10-CM | POA: Diagnosis not present

## 2023-10-15 MED ORDER — DOXYCYCLINE HYCLATE 100 MG PO TABS: 100.0000 mg | ORAL_TABLET | Freq: Two times a day (BID) | ORAL | 0 refills | Status: AC

## 2023-10-15 NOTE — Patient Instructions (Addendum)
To avoid shaving and waxing good to see you today - Thank you for coming in  Things we discussed today:  1) Your breast lesion is a skin cyst.  These are more common in areas where skin rubs against itself, such as under the breast, under the armpits, between the thighs.  These also get worse when you are sweating such as during the summer. -Take doxycycline 1 tablet twice a day for the next 5 days.  This is an antibiotic that can help treat the inflammation under your breasts and under your armpits. -Apply a warm compress for 10 minutes on the cyst 3 times a day for the next 5 days.  This will help the remaining infection drain out of the cyst.  2) For your armpits - Try to avoid irritating the skin such as shaving or deodarants. You may need to try different products to see what works best for you. I recommend trying to use gentle products and avoid strong fragrances.   3) Schedule an appointment to get your nexplanon removed.  You should start taking a prenatal vitamin now if you are planning to get pregnant in the future.

## 2023-10-15 NOTE — Progress Notes (Signed)
    SUBJECTIVE:   CHIEF COMPLAINT / HPI:    Tracey [name] is a 28yo F w/ hx of folluclitis that p/f sore on breast.  Breast sore - about 1 month - Noticed irritation inbetween cleavage and under breasts - Then noticed the sore under her L breast. It has been draining fluid. - No fevers  Armpit rash - L armpit getting "boils" for past month - worse when she applies deodorant - She also shaves underarms  Nexplanon Pt would like Nexplanon removed as she is thinking about getting pregnant.  OBJECTIVE:   BP 128/82   Pulse 86   Ht 5\' 4"  (1.626 m)   Wt 189 lb 12.8 oz (86.1 kg)   SpO2 98%   BMI 32.58 kg/m   General: Alert, pleasant woman. NAD. HEENT: NCAT. MMM. CV: RRR, no murmurs.  Resp: CTAB, no wheezing or crackles. Normal WOB on RA.  Ext: Moves all ext spontaneously Skin: Erythematous patch in L axilla, no drainage or masses. Erythema along inferior breast crease and draining open cyst <4mm with yellowish discharge, nontender to palpation and no underlying fluctuance.  Nexplanon palpated in right bicep groove.  ASSESSMENT/PLAN:   Assessment & Plan Breast cyst Open, draining cyst with surrounding inflammation that could be cellulitis vs intertrigo. No further incision needed. No systemic signs (fever). -Start doxycycline 100Mg  twice daily for 5 days -Apply warm compress 3 times a day for 10 minutes, for next 5 days -Counseled on return precautions. armpit rash Differential includes folliculitis, intertrigo, irritant dermatitis; which may have been exacerbated by shaving and deodorant.   - Counseled patient to avoid shaving and waxing if possible. -Counseled to find a different deodorant, advised to look for 1 that is fragrance free and for sensitive skin. Encounter for surveillance of implantable subdermal contraceptive Desires removal of Nexplanon for desire pregnancy.  Nexplanon palpated in right biceps groove.  Advised patient to make procedure appointment and we can remove  Nexplanon. -Advised to start daily prenatal vitamins    Lincoln Brigham, MD [facility]

## 2024-09-21 ENCOUNTER — Ambulatory Visit: Admitting: Family Medicine

## 2024-09-21 ENCOUNTER — Encounter: Payer: Self-pay | Admitting: Family Medicine

## 2024-09-21 VITALS — BP 117/88 | HR 78 | Ht 64.0 in | Wt 191.8 lb

## 2024-09-21 DIAGNOSIS — Z113 Encounter for screening for infections with a predominantly sexual mode of transmission: Secondary | ICD-10-CM

## 2024-09-21 NOTE — Progress Notes (Signed)
" ° °  SUBJECTIVE:   CHIEF COMPLAINT / HPI:   STD Screening: Patient is a 29 y.o. female presenting for STD testing.  She states she was last sexually active a couple weeks ago with her long-term partner.  She endorses no vaginal odor.  She uses contraception with Nexplanon . She does not use barrier method consistently.  PERTINENT  PMH / PSH: Reviewed  OBJECTIVE:   BP 117/88   Pulse 78   Ht 5' 4 (1.626 m)   Wt 191 lb 12.8 oz (87 kg)   LMP 09/21/2024   SpO2 100%   BMI 32.92 kg/m   General: NAD, awake and alert HEENT: NCAT. Sclera anicteric. EOMI. No rhinorrhea. Cardiovascular: RRR. No M/R/G Respiratory: CTAB, normal WOB on RA. No wheezing, crackles, rhonchi, or diminished breath sounds. Extremities: No BLE edema, no deformities or significant joint findings Skin: Warm and dry. Neuro: A&Ox3. No focal neurological deficits.   Declined pelvic exam today. ASSESSMENT/PLAN:   Assessment & Plan Screening for STD (sexually transmitted disease) - Offered Cytology - GC/Chlamydia/BV/Candida/Trich; patient declined since she is on her menstrual cycle, would like to test for this with her next pap smear (possibly on 1/23 with Nexplanon  removal) - Labs: HIV and RPR today - Discussed STD and safe sex practices provided in AVS  Kathrine Melena, DO [facility]   "

## 2024-09-21 NOTE — Assessment & Plan Note (Signed)
-   Offered Cytology - GC/Chlamydia/BV/Candida/Trich; patient declined since she is on her menstrual cycle, would like to test for this with her next pap smear (possibly on 1/23 with Nexplanon  removal) - Labs: HIV and RPR today - Discussed STD and safe sex practices provided in AVS

## 2024-09-21 NOTE — Patient Instructions (Signed)
 It was great to see you today! Thank you for choosing [facility] for your primary care. Tracey [name] was seen for STD screening.  Today we addressed: Labs - HIV and RPR to check for syphilis was performed today  We are checking some labs today. I will send you a MyChart message with your results, per your preference. If you do not hear about your labs in the next 2 weeks, please call the office.  You should return to our clinic No follow-ups on file. Please arrive 15 minutes before your appointment to ensure smooth check in process.  We appreciate your efforts in making this happen.  Thank you for allowing me to participate in your care, Kathrine Melena, DO 09/21/2024, 3:39 PM PGY-2, [facility]  STD Prevention and Safe Sex Practices - Important to note: - STDs can occur without symptoms - Pulling out is not an effective form of protection from pregnancy or STDs - Oral sex can transmit STDs too - Abstinence or being in a long-term, mutually monogamous relationship with an uninfected partner is the most reliable way to prevent STDs - Correct and consistent use of condoms can significantly reduce the risk of STDs - Limiting the number of sex partners and modifying other risky behaviors can decrease the risk of STDs - HPV vaccination is recommended for females up to age 3 and males up to age 1 (29 for men who have sex with men) - HIV testing is offered to anyone diagnosed with an STD, and pre-exposure prophylaxis may be appropriate for sexually active individuals. Post exposure prophylaxis is recommended within 72 hours of potential exposure - Please notify sexual partners if diagnosed with an STD. Expedited Partner Therapy (EPT) may be available for some infections

## 2024-09-22 ENCOUNTER — Ambulatory Visit: Payer: Self-pay | Admitting: Family Medicine

## 2024-09-22 LAB — SYPHILIS: RPR W/REFLEX TO RPR TITER AND TREPONEMAL ANTIBODIES, TRADITIONAL SCREENING AND DIAGNOSIS ALGORITHM: RPR Ser Ql: NONREACTIVE

## 2024-09-22 LAB — HIV ANTIBODY (ROUTINE TESTING W REFLEX): HIV Screen 4th Generation wRfx: NONREACTIVE

## 2024-10-01 ENCOUNTER — Ambulatory Visit: Payer: Self-pay | Admitting: Family Medicine

## 2024-10-05 NOTE — Progress Notes (Unsigned)
" ° ° °  SUBJECTIVE:   CHIEF COMPLAINT / HPI:   Nexplanon  Removal  PERTINENT  PMH / PSH: ***  OBJECTIVE:   LMP 09/21/2024   General: Awake and Alert in NAD HEENT: NCAT. Sclera anicteric. No rhinorrhea. Cardiovascular: RRR. No M/R/G Respiratory: CTAB, normal WOB on RA. No wheezing, crackles, rhonchi, or diminished breath sounds. Abdomen: Soft, non-tender, non-distended. Bowel sounds normoactive/hypoactive/hyperactive. *** Extremities: Able to move all extremities. No BLE edema, no deformities or significant joint findings. Nexplanon  palpated in R biceps groove. Skin: Warm and dry. No abrasions or rashes noted. Neuro: A&Ox***. No focal neurological deficits.  ASSESSMENT/PLAN:   Assessment & Plan    Nexplanon  Removal  After verbal and written consent was obtained, patient was placed in supine position. The patient's right arm was flexed at the elbow and externally rotated so that the wrist was parallel to their ear. The device was palpated and marked. The area was cleansed with alcohol and 1% lidocaine  with epinephrine was injected just under the distal end of the device. The site was cleaned with Betadine x3 and the area surrounding the device was covered with a sterile drape. A scalpel was used to create a small incision, and the device was pushed towards the incision. Fibrous tissue surrounding the device was gradually removed from the device. The device was grasped, removed and measured to ensure all 4 cm of device was removed. Steri-strips were used to close the incision. Pressure dressing was applied to the patient. The patient was instructed to remove the pressure dressing in 24 hours, and use backup contraception for 1 week. The patient denied any concerns or complaints and tolerated the procedure well.   Kathrine Melena, DO [facility]  {Billing info - this will automatically delete when the note is signed:1} {For Nexplanon  Removal, bill CPT code 88017  (Removal, non-biodegradable drug delivery implant):1} {Link the [phone] CPT code to the diagnosis code Z30.46 (Encounter for surveillance of implantable subdermal contraceptive):1} {Make sure your primary visit LOS code has a modifier 25:1}  "

## 2024-10-08 ENCOUNTER — Ambulatory Visit: Admitting: Family Medicine
